# Patient Record
Sex: Male | Born: 1995 | Race: Black or African American | Hispanic: No | Marital: Single | Smoking: Never smoker
Health system: Southern US, Community
[De-identification: ages and names within clinical notes are randomized; demographics above are authoritative.]

## PROBLEM LIST (undated history)

## (undated) DIAGNOSIS — K5010 Crohn's disease of large intestine without complications: Secondary | ICD-10-CM

## (undated) HISTORY — PX: ESOPHAGOGASTRODUODENOSCOPY: SHX5428

## (undated) HISTORY — PX: OTHER SURGICAL HISTORY: SHX169

## (undated) HISTORY — PX: COLONOSCOPY: SHX174

---
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
## 2019-07-09 ENCOUNTER — Ambulatory Visit: Payer: Self-pay | Attending: Internal Medicine

## 2019-07-09 DIAGNOSIS — Z23 Encounter for immunization: Secondary | ICD-10-CM

## 2019-07-09 NOTE — Progress Notes (Signed)
   Covid-19 Vaccination Clinic  Name:  Isaiah Andersen    MRN: [id number] DOB: 12-15-1995  07/09/2019  Mr. [name] was observed post Covid-19 immunization for 15 minutes without incident. He was provided with Vaccine Information Sheet and instruction to access the V-Safe system.   Mr. [name] was instructed to call 911 with any severe reactions post vaccine: Marland Kitchen Difficulty breathing  . Swelling of face and throat  . A fast heartbeat  . A bad rash all over body  . Dizziness and weakness   Immunizations Administered    Name Date Dose VIS Date Route   Pfizer COVID-19 Vaccine 07/09/2019  2:31 PM 0.3 mL 03/24/2019 Intramuscular   Manufacturer: [employer], [employer]   Lot: [id number]   NDC: [address]-5

## 2019-08-01 ENCOUNTER — Ambulatory Visit: Payer: Self-pay | Attending: Internal Medicine

## 2019-08-01 DIAGNOSIS — Z23 Encounter for immunization: Secondary | ICD-10-CM

## 2019-08-01 NOTE — Progress Notes (Signed)
   Covid-19 Vaccination Clinic  Name:  Isaiah Andersen    MRN: [id number] DOB: Oct 20, 1995  08/01/2019  Mr. Isaiah Andersen was observed post Covid-19 immunization for 15 minutes without incident. He was provided with Vaccine Information Sheet and instruction to access the V-Safe system.   Mr. Isaiah Andersen was instructed to call 911 with any severe reactions post vaccine: Marland Kitchen Difficulty breathing  . Swelling of face and throat  . A fast heartbeat  . A bad rash all over body  . Dizziness and weakness   Immunizations Administered    Name Date Dose VIS Date Route   Pfizer COVID-19 Vaccine 08/01/2019  3:07 PM 0.3 mL 06/07/2018 Intramuscular   Manufacturer: [employer], [employer]   Lot: [id number]   NDC: [address]-3

## 2020-12-07 ENCOUNTER — Encounter: Payer: Self-pay | Admitting: Emergency Medicine

## 2020-12-07 ENCOUNTER — Emergency Department: Payer: 59

## 2020-12-07 ENCOUNTER — Observation Stay
Admission: EM | Admit: 2020-12-07 | Discharge: 2020-12-08 | Disposition: A | Discharge status: Home / Self Care | Payer: 59 | Attending: Internal Medicine | Admitting: Internal Medicine

## 2020-12-07 ENCOUNTER — Other Ambulatory Visit: Payer: Self-pay

## 2020-12-07 DIAGNOSIS — R030 Elevated blood-pressure reading, without diagnosis of hypertension: Secondary | ICD-10-CM

## 2020-12-07 DIAGNOSIS — K50012 Crohn's disease of small intestine with intestinal obstruction: Secondary | ICD-10-CM

## 2020-12-07 DIAGNOSIS — K529 Noninfective gastroenteritis and colitis, unspecified: Principal | ICD-10-CM | POA: Insufficient documentation

## 2020-12-07 DIAGNOSIS — K56609 Unspecified intestinal obstruction, unspecified as to partial versus complete obstruction: Secondary | ICD-10-CM | POA: Diagnosis not present

## 2020-12-07 DIAGNOSIS — R1031 Right lower quadrant pain: Secondary | ICD-10-CM | POA: Diagnosis present

## 2020-12-07 DIAGNOSIS — K5000 Crohn's disease of small intestine without complications: Secondary | ICD-10-CM | POA: Diagnosis present

## 2020-12-07 DIAGNOSIS — Z20822 Contact with and (suspected) exposure to covid-19: Secondary | ICD-10-CM | POA: Diagnosis not present

## 2020-12-07 DIAGNOSIS — Z9049 Acquired absence of other specified parts of digestive tract: Secondary | ICD-10-CM

## 2020-12-07 DIAGNOSIS — Z6838 Body mass index (BMI) 38.0-38.9, adult: Secondary | ICD-10-CM

## 2020-12-07 DIAGNOSIS — K56600 Partial intestinal obstruction, unspecified as to cause: Secondary | ICD-10-CM

## 2020-12-07 DIAGNOSIS — E669 Obesity, unspecified: Secondary | ICD-10-CM | POA: Diagnosis present

## 2020-12-07 DIAGNOSIS — K50119 Crohn's disease of large intestine with unspecified complications: Secondary | ICD-10-CM

## 2020-12-07 DIAGNOSIS — K50812 Crohn's disease of both small and large intestine with intestinal obstruction: Secondary | ICD-10-CM | POA: Diagnosis present

## 2020-12-07 HISTORY — DX: Crohn's disease of large intestine without complications: K50.10

## 2020-12-07 LAB — COMPREHENSIVE METABOLIC PANEL
ALT: 19 U/L (ref 0–44)
AST: 21 U/L (ref 15–41)
Albumin: 4.3 g/dL (ref 3.5–5.0)
Alkaline Phosphatase: 60 U/L (ref 38–126)
Anion gap: 8 (ref 5–15)
BUN: 12 mg/dL (ref 6–20)
CO2: 28 mmol/L (ref 22–32)
Calcium: 9.3 mg/dL (ref 8.9–10.3)
Chloride: 103 mmol/L (ref 98–111)
Creatinine, Ser: 0.93 mg/dL (ref 0.61–1.24)
GFR, Estimated: >60 mL/min (ref >60)
Glucose, Bld: 104 mg/dL — ABNORMAL HIGH (ref 70–99)
Potassium: 4.4 mmol/L (ref 3.5–5.1)
Sodium: 139 mmol/L (ref 135–145)
Total Bilirubin: 0.6 mg/dL (ref 0.3–1.2)
Total Protein: 7.4 g/dL (ref 6.5–8.1)

## 2020-12-07 LAB — CBC
HCT: 45 % (ref 39.0–52.0)
Hemoglobin: 14.9 g/dL (ref 13.0–17.0)
MCH: 27.2 pg (ref 26.0–34.0)
MCHC: 33.1 g/dL (ref 30.0–36.0)
MCV: 82.1 fL (ref 80.0–100.0)
Platelets: 458 10*3/uL — ABNORMAL HIGH (ref 150–400)
RBC: 5.48 MIL/uL (ref 4.22–5.81)
RDW: 13.5 % (ref 11.5–15.5)
WBC: 14.9 10*3/uL — ABNORMAL HIGH (ref 4.0–10.5)
nRBC: 0 % (ref 0.0–0.2)

## 2020-12-07 LAB — LIPASE, BLOOD: Lipase: 35 U/L (ref 11–51)

## 2020-12-07 LAB — RESP PANEL BY RT-PCR (FLU A&B, COVID) ARPGX2
Influenza A by PCR: NEGATIVE
Influenza B by PCR: NEGATIVE
SARS Coronavirus 2 by RT PCR: NEGATIVE

## 2020-12-07 MED ORDER — TRAZODONE HCL 50 MG PO TABS: 25.0000 mg | ORAL_TABLET | Freq: Every evening | ORAL | Status: DC | PRN

## 2020-12-07 MED ORDER — SODIUM CHLORIDE 0.9 % IV SOLN: INTRAVENOUS | Status: DC

## 2020-12-07 MED ORDER — MORPHINE SULFATE (PF) 4 MG/ML IV SOLN
4.0000 mg | Freq: Once | INTRAVENOUS | Status: AC
  Administered 2020-12-07: 4 mg via INTRAVENOUS
  Filled 2020-12-07: qty 1

## 2020-12-07 MED ORDER — METHYLPREDNISOLONE SODIUM SUCC 40 MG IJ SOLR: 40.0000 mg | Freq: Every day | INTRAMUSCULAR | Status: DC

## 2020-12-07 MED ORDER — MAGNESIUM HYDROXIDE 400 MG/5ML PO SUSP: 30.0000 mL | Freq: Every day | ORAL | Status: DC | PRN

## 2020-12-07 MED ORDER — SODIUM CHLORIDE 0.9 % IV BOLUS
1000.0000 mL | Freq: Once | INTRAVENOUS | Status: AC
  Administered 2020-12-07: 1000 mL via INTRAVENOUS

## 2020-12-07 MED ORDER — ENOXAPARIN SODIUM 60 MG/0.6ML IJ SOSY
60.0000 mg | PREFILLED_SYRINGE | INTRAMUSCULAR | Status: DC
  Administered 2020-12-07: 60 mg via SUBCUTANEOUS
  Filled 2020-12-07: qty 0.6

## 2020-12-07 MED ORDER — ONDANSETRON HCL 4 MG/2ML IJ SOLN: 4.0000 mg | Freq: Four times a day (QID) | INTRAMUSCULAR | Status: DC | PRN

## 2020-12-07 MED ORDER — IOHEXOL 350 MG/ML SOLN
80.0000 mL | Freq: Once | INTRAVENOUS | Status: AC | PRN
  Administered 2020-12-07: 80 mL via INTRAVENOUS

## 2020-12-07 MED ORDER — ONDANSETRON HCL 4 MG/2ML IJ SOLN
4.0000 mg | Freq: Once | INTRAMUSCULAR | Status: AC
  Administered 2020-12-07: 4 mg via INTRAVENOUS
  Filled 2020-12-07: qty 2

## 2020-12-07 MED ORDER — METHYLPREDNISOLONE SODIUM SUCC 125 MG IJ SOLR
60.0000 mg | Freq: Once | INTRAMUSCULAR | Status: AC
  Administered 2020-12-07: 60 mg via INTRAVENOUS
  Filled 2020-12-07: qty 2

## 2020-12-07 MED ORDER — ACETAMINOPHEN 650 MG RE SUPP: 650.0000 mg | Freq: Four times a day (QID) | RECTAL | Status: DC | PRN

## 2020-12-07 MED ORDER — ACETAMINOPHEN 325 MG PO TABS: 650.0000 mg | ORAL_TABLET | Freq: Four times a day (QID) | ORAL | Status: DC | PRN

## 2020-12-07 MED ORDER — MORPHINE SULFATE (PF) 2 MG/ML IV SOLN: 2.0000 mg | INTRAVENOUS | Status: DC | PRN

## 2020-12-07 MED ORDER — ONDANSETRON HCL 4 MG PO TABS: 4.0000 mg | ORAL_TABLET | Freq: Four times a day (QID) | ORAL | Status: DC | PRN

## 2020-12-07 NOTE — ED Notes (Signed)
Request made for transport to the floor ?

## 2020-12-07 NOTE — Progress Notes (Signed)
PHARMACIST - PHYSICIAN COMMUNICATION  CONCERNING:  Enoxaparin (Lovenox) for DVT Prophylaxis    RECOMMENDATION: Patient was prescribed enoxaprin 40mg  q24 hours for VTE prophylaxis.   Filed Weights   12/07/20 1618  Weight: 127 kg (280 lb)    Body mass index is 37.97 kg/m.  Estimated Creatinine Clearance: 165.8 mL/min (by C-G formula based on SCr of 0.93 mg/dL).   Based on [facility] policy patient is candidate for enoxaparin 0.5mg /kg TBW SQ every 24 hours based on BMI being >30.  DESCRIPTION: Pharmacy has adjusted enoxaparin dose per [facility] policy.  Patient is now receiving enoxaparin 60 mg every 24 hours    [facility], PharmD, BCPS Clinical Pharmacist  12/07/2020 8:10 PM

## 2020-12-07 NOTE — ED Triage Notes (Signed)
Pt via POV from home. Pt c/o RLQ abd pain and emesis since this AM. Denies diarrhea. Denies fever. Pt has a hx of Crohns Disease. Pt is A&OX4 and NAD.

## 2020-12-07 NOTE — ED Notes (Signed)
Informed RN bed assigned 

## 2020-12-07 NOTE — ED Notes (Signed)
Received secure chat from Burnadette Pop, RN that ok to bring patient up.

## 2020-12-07 NOTE — ED Notes (Signed)
Patient transported to CT 

## 2020-12-07 NOTE — ED Provider Notes (Signed)
[facility] Emergency Department Provider Note  Time seen: 10:48 PM  I have reviewed the triage vital signs and the nursing notes.   HISTORY  Chief Complaint Abdominal Pain   HPI Isaiah Andersen is a 25 y.o. male with a past medical history of Crohn's disease status post partial colectomy 9 years ago who is presents to the emergency department for right lower quadrant abdominal pain since yesterday now with nausea and vomiting and worsening pain today.  Patient denies any known fever.  States he has not had a Crohn's flare for approximately 10 years.  Has not followed up with a GI doctor recently.   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  Social History Social History   Tobacco Use   Smoking status: Never   Smokeless tobacco: Never  Substance Use Topics   Alcohol use: Never   Drug use: Never    Review of Systems Constitutional: Negative for fever. Cardiovascular: Negative for chest pain. Respiratory: Negative for shortness of breath. Gastrointestinal: Moderate right lower quadrant abdominal pain, dull. Genitourinary: Negative for urinary compaints Musculoskeletal: Negative for musculoskeletal complaints Neurological: Negative for headache All other ROS negative  ____________________________________________   PHYSICAL EXAM:  VITAL SIGNS: ED Triage Vitals  Enc Vitals Group     BP 12/07/20 1617 (!) 152/119     Pulse Rate 12/07/20 1617 83     Resp 12/07/20 1617 18     Temp 12/07/20 1617 98.7 F (37.1 C)     Temp Source 12/07/20 1617 Oral     SpO2 12/07/20 1617 100 %     Weight 12/07/20 1618 280 lb (127 kg)     Height 12/07/20 1618 6' (1.829 m)     Head Circumference --      Peak Flow --      Pain Score 12/07/20 1618 7      Pain Loc --      Pain Edu? --      Excl. in GC? --    Constitutional: Alert and oriented. Well appearing and in no distress. Eyes: Normal exam ENT      Head: Normocephalic and atraumatic.      Mouth/Throat: Mucous membranes are moist. Cardiovascular: Normal rate, regular rhythm.  Respiratory: Normal respiratory effort without tachypnea nor retractions. Breath sounds are clear  Gastrointestinal: Soft, moderate right lower quadrant tenderness palpation without rebound guarding or distention. Musculoskeletal: Nontender with normal range of motion in all extremities.  Neurologic:  Normal speech and language. No gross focal neurologic deficits Skin:  Skin is warm, dry and intact.  Psychiatric: Mood and affect are normal.   ____________________________________________    RADIOLOGY  IMPRESSION:  1. Postoperative changes and adjacent moderate to marked severity  enteritis of the terminal/distal ileum with subsequent partial small  bowel obstruction.  2. Hepatic steatosis.  3. Aortic atherosclerosis.   ____________________________________________   INITIAL IMPRESSION / ASSESSMENT AND PLAN / ED COURSE  Pertinent labs & imaging results that were available during my care of the patient were reviewed by me and considered in my medical decision making (see chart for details).   Patient presents emergency department for right lower quadrant abdominal pain, history of Crohn's disease but not had a flare  in approximately 10 years.  Patient is moderately tender in the right lower quadrant, has moderate leukocytosis.  We will obtain CT imaging to further evaluate.  Lab work otherwise largely nonrevealing.  Patient CT scan shows likely Crohn's flare in the terminal ileum causing a partial small bowel obstruction.  I spoke to Dr. Norma Fredrickson of GI medicine who recommends starting the patient on 60 mg of IV Solu-Medrol I have sent stool studies as well.  Patient will be admitted to the hospital  service for further work-up and treatment with GI consultation.  Isaiah Andersen was evaluated in Emergency Department on 12/07/2020 for the symptoms described in the history of present illness. He was evaluated in the context of the global COVID-19 pandemic, which necessitated consideration that the patient might be at risk for infection with the SARS-CoV-2 virus that causes COVID-19. Institutional protocols and algorithms that pertain to the evaluation of patients at risk for COVID-19 are in a state of rapid change based on information released by regulatory bodies including the CDC and federal and state organizations. These policies and algorithms were followed during the patient's care in the ED.  ____________________________________________   FINAL CLINICAL IMPRESSION(S) / ED DIAGNOSES  Partial small bowel obstruction Crohn's flare   Minna Antis, MD 12/07/20 2250

## 2020-12-07 NOTE — ED Notes (Signed)
Informed RN bed assigned again 

## 2020-12-07 NOTE — H&P (Signed)
[place]   PATIENT NAME: Isaiah Andersen    MR#:  [id number]  DATE OF BIRTH:  04--1997  DATE OF ADMISSION:  12/07/2020  PRIMARY CARE PHYSICIAN: Pcp, No   Patient is coming from: Home  REQUESTING/REFERRING PHYSICIAN: Minna Antis, MD  CHIEF COMPLAINT:   Chief Complaint  Patient presents with   Abdominal Pain    HISTORY OF PRESENT ILLNESS:  Isaiah Andersen is a 25 y.o. African-American male with medical history significant for Crohn's disease, who presented to the emergency room with acute onset of right lower quadrant abdominal pain with associated vomiting since this morning without diarrhea. Last BM was this am at 11am. No fever or chills. No bilious vomitus or hematemesis.  No cough or wheezing or dyspnea.  No chest pain or palpitations.  No dysuria, oliguria or hematuria or flank pain.  No melena or bright red bleeding per rectum.  No other bleeding diathesis.  ED Course: Upon presentation to the ER blood pressure was 152/119 and later 148/83 with otherwise normal vital signs  Imaging: Abdominal pelvic CT scan showed the following: 1. Postoperative changes and adjacent moderate to marked severity enteritis of the terminal/distal ileum with subsequent partial small bowel obstruction. 2. Hepatic steatosis. 3. Aortic atherosclerosis.  The patient was given 4 mg of IV morphine sulfate and 4 mg of IV Zofran, 1 L bolus of IV normal saline and 60 mg of IV Solu-Medrol.  Dr. Norma Fredrickson was notified about the patient.  The patient will be admitted to a medical bed for further evaluation and management.   PAST MEDICAL HISTORY:   Past Medical History:  Diagnosis Date   Crohn's colitis (HCC)     PAST SURGICAL HISTORY:  Partial colectomy  SOCIAL HISTORY:   Social History   Tobacco Use   Smoking status: Never   Smokeless tobacco: Never  Substance Use Topics   Alcohol use: Never    FAMILY HISTORY:  No family history on file.  He denies any familial diseases.  DRUG  ALLERGIES:  No Known Allergies  REVIEW OF SYSTEMS:   ROS As per history of present illness. All pertinent systems were reviewed above. Constitutional, HEENT, cardiovascular, respiratory, GI, GU, musculoskeletal, neuro, psychiatric, endocrine, integumentary and hematologic systems were reviewed and are otherwise negative/unremarkable except for positive findings mentioned above in the HPI.   MEDICATIONS AT HOME:   Prior to Admission medications   Not on File      VITAL SIGNS:  Blood pressure (!) 148/83, pulse 76, temperature 98.7 F (37.1 C), temperature source Oral, resp. rate 18, height 6' (1.829 m), weight 127 kg, SpO2 100 %.  PHYSICAL EXAMINATION:  Physical Exam  GENERAL:  25 y.o.-year-old obese African-American male patient lying in the bed with no acute distress.  EYES: Pupils equal, round, reactive to light and accommodation. No scleral icterus. Extraocular muscles intact.  HEENT: Head atraumatic, normocephalic. Oropharynx and nasopharynx clear.  NECK:  Supple, no jugular venous distention. No thyroid enlargement, no tenderness.  LUNGS: Normal breath sounds bilaterally, no wheezing, rales,rhonchi or crepitation. No use of accessory muscles of respiration.  CARDIOVASCULAR: Regular rate and rhythm, S1, S2 normal. No murmurs, rubs, or gallops.  ABDOMEN: Soft, nondistended, with right lower quadrant abdominal tenderness without rebound tenderness or guarding or rigidity. Bowel sounds were diminished.  No organomegaly or mass.  EXTREMITIES: No pedal edema, cyanosis, or clubbing.  NEUROLOGIC: Cranial nerves II through XII are intact. Muscle strength 5/5 in all extremities. Sensation intact. Gait not checked.  PSYCHIATRIC: The patient is alert and oriented x 3.  Normal affect and good eye contact. SKIN: No obvious rash, lesion, or ulcer.   LABORATORY PANEL:   CBC Recent Labs  Lab 12/07/20 1618  WBC 14.9*  HGB 14.9  HCT 45.0  PLT 458*    ------------------------------------------------------------------------------------------------------------------  Chemistries  Recent Labs  Lab 12/07/20 1618  NA 139  K 4.4  CL 103  CO2 28  GLUCOSE 104*  BUN 12  CREATININE 0.93  CALCIUM 9.3  AST 21  ALT 19  ALKPHOS 60  BILITOT 0.6   ------------------------------------------------------------------------------------------------------------------  Cardiac Enzymes No results for input(s): TROPONINI in the last 168 hours. ------------------------------------------------------------------------------------------------------------------  RADIOLOGY:  Abdominal pelvic CT scan showed the following: 1. Postoperative changes and adjacent moderate to marked severity enteritis of the terminal/distal ileum with subsequent partial small bowel obstruction. 2. Hepatic steatosis. 3. Aortic atherosclerosis.  IMPRESSION AND PLAN:  Active Problems:   Terminal ileitis (HCC)  1.  Crohn's disease with acute flare with moderate to severe enteritis of the terminal/distal ileum. - The patient will be admitted to a medical bed. - We will continue steroid therapy. - As needed antiemetics will be provided.   - We will add IV PPI therapy. - Pain management to be provided. - GI consultation will be obtained. - Dr. Norma Fredrickson was notified about the patient.  2.  Partial small bowel obstruction secondary to severe enteritis of the distal/terminal ileum. - The patient will be kept n.p.o. - We will place him on hydration with IV normal saline.  3.  Elevated blood pressure. - This likely secondary to pain. - Adequate pain management. - As needed labetalol will be provided.   DVT prophylaxis: Lovenox.  Code Status: full code.  Family Communication:  The plan of care was discussed in details with the patient (and family). I answered all questions. The patient agreed to proceed with the above mentioned plan. Further management will depend upon  hospital course. Disposition Plan: Back to previous home environment Consults called: Gastroenterology. All the records are reviewed and case discussed with ED provider.  Status is: Inpatient  Remains inpatient appropriate because:Ongoing active pain requiring inpatient pain management, Ongoing diagnostic testing needed not appropriate for outpatient work up, Unsafe d/c plan, IV treatments appropriate due to intensity of illness or inability to take PO, and Inpatient level of care appropriate due to severity of illness  Dispo: The patient is from: Home              Anticipated d/c is to: Home              Patient currently is not medically stable to d/c.   Difficult to place patient No   TOTAL TIME TAKING CARE OF THIS PATIENT: 55 minutes.    Hannah Beat M.D on 12/07/2020 at 7:56 PM  [facility]   From 7 PM-7 AM, contact night-coverage [email or web address]  CC: Primary care physician; Pcp, No

## 2020-12-08 ENCOUNTER — Encounter: Payer: Self-pay | Admitting: Family Medicine

## 2020-12-08 ENCOUNTER — Inpatient Hospital Stay: Payer: 59

## 2020-12-08 DIAGNOSIS — K50012 Crohn's disease of small intestine with intestinal obstruction: Secondary | ICD-10-CM | POA: Diagnosis not present

## 2020-12-08 LAB — CBC
HCT: 45.3 % (ref 39.0–52.0)
Hemoglobin: 14.4 g/dL (ref 13.0–17.0)
MCH: 25.9 pg — ABNORMAL LOW (ref 26.0–34.0)
MCHC: 31.8 g/dL (ref 30.0–36.0)
MCV: 81.5 fL (ref 80.0–100.0)
Platelets: 468 10*3/uL — ABNORMAL HIGH (ref 150–400)
RBC: 5.56 MIL/uL (ref 4.22–5.81)
RDW: 13.6 % (ref 11.5–15.5)
WBC: 11.1 10*3/uL — ABNORMAL HIGH (ref 4.0–10.5)
nRBC: 0 % (ref 0.0–0.2)

## 2020-12-08 LAB — URINALYSIS, COMPLETE (UACMP) WITH MICROSCOPIC
Bacteria, UA: NONE SEEN
Bilirubin Urine: NEGATIVE
Glucose, UA: NEGATIVE mg/dL
Hgb urine dipstick: NEGATIVE
Ketones, ur: 5 mg/dL — AB
Leukocytes,Ua: NEGATIVE
Nitrite: NEGATIVE
Protein, ur: NEGATIVE mg/dL
Specific Gravity, Urine: 1.028 (ref 1.005–1.030)
Squamous Epithelial / HPF: NONE SEEN (ref 0–5)
pH: 6 (ref 5.0–8.0)

## 2020-12-08 LAB — BASIC METABOLIC PANEL
Anion gap: 9 (ref 5–15)
BUN: 10 mg/dL (ref 6–20)
CO2: 27 mmol/L (ref 22–32)
Calcium: 9.2 mg/dL (ref 8.9–10.3)
Chloride: 101 mmol/L (ref 98–111)
Creatinine, Ser: 0.84 mg/dL (ref 0.61–1.24)
GFR, Estimated: >60 mL/min (ref >60)
Glucose, Bld: 124 mg/dL — ABNORMAL HIGH (ref 70–99)
Potassium: 4.1 mmol/L (ref 3.5–5.1)
Sodium: 137 mmol/L (ref 135–145)

## 2020-12-08 LAB — C-REACTIVE PROTEIN: CRP: 1.1 mg/dL — ABNORMAL HIGH (ref <1.0)

## 2020-12-08 LAB — HIV ANTIBODY (ROUTINE TESTING W REFLEX): HIV Screen 4th Generation wRfx: NONREACTIVE

## 2020-12-08 MED ORDER — METHYLPREDNISOLONE SODIUM SUCC 125 MG IJ SOLR: 60.0000 mg | Freq: Every day | INTRAMUSCULAR | Status: DC

## 2020-12-08 MED ORDER — PREDNISONE 20 MG PO TABS: 40.0000 mg | ORAL_TABLET | Freq: Every day | ORAL | 0 refills | Status: AC

## 2020-12-08 MED ORDER — ONDANSETRON HCL 4 MG PO TABS: 4.0000 mg | ORAL_TABLET | Freq: Four times a day (QID) | ORAL | 0 refills | Status: AC | PRN

## 2020-12-08 MED ORDER — COVID-19 MRNA VAC-TRIS(PFIZER) 30 MCG/0.3ML IM SUSP: 0.3000 mL | Freq: Once | INTRAMUSCULAR | Status: DC

## 2020-12-08 NOTE — Plan of Care (Signed)
Continuing with plan of care. 

## 2020-12-08 NOTE — Plan of Care (Signed)
Discharge teaching completed with patient who is in stable condition. 

## 2020-12-08 NOTE — Consult Note (Signed)
[facility] GI Inpatient Consult Note   Jamey Reas, M.D.  Reason for Consult: Crhon's disease flare, abdominal pain, partial small bowel obstruction   Attending Requesting Consult: Minna Antis, M.D. (ED)                                                       Valente David, M.D. (Hospitalist)  History of Present Illness: Isaiah Andersen is a 25 y.o. male with history of small bowel and large bowel Crohn's disease status post partial colectomy.  Patient has not followed up in several years with a gastroenterologist and has reportedly taken no Crohn's therapy EVER up to the time of presentation yesterday evening to the emergency room. Remote outpatient family medicine notes reviewed from 2017 revealing patient was in an extended state of remission, stating he felt like he "didn't have it" at the time. I don't see a visit to a gastroenterologist in the record ([facility]) since 2013. Procedure and office visit notes not accessible in [facility], though there is record that he has seen Dr. Barton Fanny at [facility] GI department in 2013 and underwent a colonoscopy (no documentation avail) 03/09/2012.  Patient says over the past week, he had experienced intermittent, sometimes severe (8 out of 10) cramping in the RLQ exacerbated with postural changes of the trunk. It culminated in nausea and vomiting yesterday morning, prompting him to report to the ED in the afternoon. Patient's normal bowel habits is usually "3 loose stools" per day without fecal urgency, tenesmus or hematochezia. Yesterday and the day prior he was unable to have a bowel movement. CT compatible with terminal ileitis with PSBO near the ileocolic anastomosis. I instructed ED to run some stool tests which have not been obtained due to PSBO. I advised Solumedrol 60mg  IV also yesterday evening. Patient made NPO. Today, patient reports severity of pain in RLQ is "0 or 1" out of 10 and he would like to eat. He is agreeable to following up  with a gastroenterologist,but wants to stay in [facility] for GI management.    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 No pertinent surgical history.  Allergies: No Known Allergies  Home Medications: No medications prior to admission.   Home medication reconciliation was completed with the patient.   Scheduled Inpatient Medications:    enoxaparin (LOVENOX) injection  60 mg Subcutaneous Q24H   [START ON 12/09/2020] methylPREDNISolone (SOLU-MEDROL) injection  60 mg Intravenous Daily    Continuous Inpatient Infusions:    sodium chloride 100 mL/hr at 12/08/20 0721    PRN Inpatient Medications:  acetaminophen **OR** acetaminophen, magnesium hydroxide, morphine injection, ondansetron **OR** ondansetron (ZOFRAN) IV, traZODone  Family History: family history is not on file.   GI Family History: Negative  Social History:   reports that he has never smoked. He has never used smokeless tobacco. He reports that he does not drink alcohol and does not use drugs. The patient denies ETOH, tobacco, or drug use.    Review of Systems: Review of Systems - General ROS: negative Psychological ROS: negative Ophthalmic ROS: negative for - blurry vision, decreased vision, dry  eyes, or eye pain ENT ROS: negative for - oral lesions or sore throat Allergy and Immunology ROS: negative Hematological and Lymphatic ROS: negative Endocrine ROS: negative Respiratory ROS: no cough, shortness of breath, or wheezing Cardiovascular ROS: no chest pain or dyspnea on exertion Genito-Urinary ROS: no dysuria, trouble voiding, or hematuria Musculoskeletal ROS: negative Neurological ROS: no TIA or stroke symptoms Dermatological ROS: negative  Physical Examination: BP (!) 128/93 (BP Location: Left Arm)    Pulse 63   Temp 98 F (36.7 C) (Oral)   Resp 20   Ht 6' (1.829 m)   Wt 129.7 kg   SpO2 100%   BMI 38.78 kg/m  Physical Exam  Data: Lab Results  Component Value Date   WBC 11.1 (H) 12/08/2020   HGB 14.4 12/08/2020   HCT 45.3 12/08/2020   MCV 81.5 12/08/2020   PLT 468 (H) 12/08/2020   Recent Labs  Lab 12/07/20 1618 12/08/20 0416  HGB 14.9 14.4   Lab Results  Component Value Date   NA 137 12/08/2020   K 4.1 12/08/2020   CL 101 12/08/2020   CO2 27 12/08/2020   BUN 10 12/08/2020   CREATININE 0.84 12/08/2020   Lab Results  Component Value Date   ALT 19 12/07/2020   AST 21 12/07/2020   ALKPHOS 60 12/07/2020   BILITOT 0.6 12/07/2020   No results for input(s): APTT, INR, PTT in the last 168 hours. CBC Latest Ref Rng & Units 12/08/2020 12/07/2020  WBC 4.0 - 10.5 K/uL 11.1(H) 14.9(H)  Hemoglobin 13.0 - 17.0 g/dL 09.9 83.3  Hematocrit 82.5 - 52.0 % 45.3 45.0  Platelets 150 - 400 K/uL 468(H) 458(H)    STUDIES: CT ABDOMEN PELVIS W CONTRAST  Result Date: 12/07/2020 CLINICAL DATA:  History of Crohn's disease with right lower quadrant pain. EXAM: CT ABDOMEN AND PELVIS WITH CONTRAST TECHNIQUE: Multidetector CT imaging of the abdomen and pelvis was performed using the standard protocol following bolus administration of intravenous contrast. CONTRAST:  9mL OMNIPAQUE IOHEXOL 350 MG/ML SOLN COMPARISON:  None. FINDINGS: Lower chest: No acute abnormality. Hepatobiliary: There is diffuse fatty infiltration of the liver parenchyma. No focal liver abnormality is seen. No gallstones, gallbladder wall thickening, or biliary dilatation. Pancreas: Unremarkable. No pancreatic ductal dilatation or surrounding inflammatory changes. Spleen: Normal in size without focal abnormality. Adrenals/Urinary Tract: Adrenal glands are unremarkable. Kidneys are normal, without renal calculi, focal lesion, or hydronephrosis. Bladder is unremarkable. Stomach/Bowel: Stomach is within normal limits. Postoperative  changes are seen with surgically anastomosed bowel noted along the expected region of the distal ascending colon. Multiple dilated, moderate to markedly thickened and inflamed loops of distal and terminal ileum are seen (maximum small bowel diameter of approximately 3.1 cm). A transition zone is seen at the surgical anastomosis site. A mild amount of peri-intestinal fluid is present in this region. Vascular/Lymphatic: Aortic atherosclerosis. No enlarged abdominal or pelvic lymph nodes. Reproductive: Prostate is unremarkable. Other: No abdominal wall hernia or abnormality. Musculoskeletal: No acute or significant osseous findings. IMPRESSION: 1. Postoperative changes and adjacent moderate to marked severity enteritis of the terminal/distal ileum with subsequent partial small bowel obstruction. 2. Hepatic steatosis. 3. Aortic atherosclerosis. Aortic Atherosclerosis (ICD10-I70.0). Electronically Signed   By: Aram Candela M.D.   On: 12/07/2020 18:54   DG Abd 2 Views  Result Date: 12/08/2020 CLINICAL DATA:  25 year old male with partial small bowel obstruction suspected on CT yesterday. Crohn disease. EXAM: ABDOMEN - 2 VIEW COMPARISON:  CT Abdomen and Pelvis 10/30/2020. FINDINGS: Upright and supine views  of the abdomen and pelvis. Negative lung bases. Bowel anastomosis redemonstrated in the right abdomen. Increased distention of the urinary bladder. Bowel-gas pattern appears stable from the CT scout view yesterday, although the fluid-filled right abdominal loops were not apparent at that time either. Other abdominal and pelvic visceral contours appear normal. No osseous abnormality identified. IMPRESSION: Stable bowel-gas pattern from the CT scout view yesterday, appearing normal although the fluid distended right abdominal small bowel loops in vicinity of the bowel anastomosis were occult at that time. No free air. Electronically Signed   By: Odessa Fleming M.D.   On: 12/08/2020 08:33    @IMAGES @  Assessment:  Crohn's disease flare - appears mainly in distal small bowel near surgical anastomosis. Clinically improved with IV steroids. Abdominal pain secondary to Crohn's - Clinically resolved. Partial SBO - Clinically resolving.  COVID-19 status:Tested negative     Recommendations: Continue steroids. Switch to po prednisone 40mg  daily. Advance to soft diet. Patient may be discharged if tolerates diet without symptoms. Patient agreeable to follow up with me in 1-2 weeks at [facility]. Advise outpatient colonoscopy. Patient agreeable to set up at office appointment.  Recommendations discussed with Dr. .  Thank you for the consult. Please call with questions or concerns.  [facility], "Georgeann Oppenheim MD [facility] [address] [place], [address] [place] [phone]  12/08/2020 11:52 AM

## 2020-12-08 NOTE — Discharge Summary (Signed)
Physician Discharge Summary  Isaiah Andersen ZOX:[id number] DOB: [birth year] DOA: 12/07/2020  PCP: Pcp, No  Admit date: 12/07/2020 Discharge date: 12/08/2020  Admitted From: Home Disposition: Home  Recommendations for Outpatient Follow-up:  Follow-up with gastroenterology within 1 to 2 weeks  Home Health: No Equipment/Devices: None  Discharge Condition: Stable CODE STATUS: Full Diet recommendation: Soft/bland  Brief/Interim Summary:  25 y.o. African-American male with medical history significant for Crohn's disease, who presented to the emergency room with acute onset of right lower quadrant abdominal pain with associated vomiting since this morning without diarrhea. Last BM was this am at 11am. No fever or chills. No bilious vomitus or hematemesis.  No cough or wheezing or dyspnea.  No chest pain or palpitations.  No dysuria, oliguria or hematuria or flank pain.  No melena or bright red bleeding per rectum.   GI contacted from ED.  Recommended initiation of 60 mg of IV Solu-Medrol daily.  This has been initiated with good result.  Patient endorses improvement in abdominal pain.  Downtrending leukocytosis.  Hemodynamically stable.  Seen in consultation with GI.  Diet was advanced to soft.  Patient tolerated without issue.  No abdominal pain.  Passing flatus.  Cleared for discharge at this time.  Per GI recommendations will discharge on prednisone 40 mg daily.  Will provide 2-week course.  Patient is instructed to follow-up with gastroenterology as outpatient within this 2-week window.  Recommended to maintain soft bland diet with adequate p.o. hydration.  Expressed understanding.  Discharged in stable condition.  Discharge Diagnoses:  Active Problems:   Terminal ileitis (HCC)  Suspected acute Crohn's flare with moderate-severe enteritis of the distal/terminal ileum Patient has history of Crohn's but has not had flare in 10 years Does not follow regularly with GI No clear trigger for  flare GI contacted from ED Seen in consultation Diet advance to soft, tolerating at time of discharge Discharge home with 2-week course of prednisone 40 mg daily Follow-up outpatient GI within 2 weeks   Partial SBO secondary to severe enteritis Diet advance to soft and patient tolerating without issue Is passing flatus Discharge home with outpatient GI follow-up   Elevated blood pressure Likely secondary to pain Resolved and pain-free at time of discharge   Obesity BMI 38.7 This complicates overall care and prognosis  Discharge Instructions  Discharge Instructions     Diet - low sodium heart healthy   Complete by: As directed    Increase activity slowly   Complete by: As directed       Allergies as of 12/08/2020   No Known Allergies      Medication List     TAKE these medications    ondansetron 4 MG tablet Commonly known as: ZOFRAN Take 1 tablet (4 mg total) by mouth every 6 (six) hours as needed for nausea.   predniSONE 20 MG tablet Commonly known as: DELTASONE Take 2 tablets (40 mg total) by mouth daily for 14 days.        Follow-up Information     [place], Boykin Nearing, MD. Schedule an appointment as soon as possible for a visit.   Specialty: Gastroenterology Why: Call office to set up follow up appointment within 2 weeks Contact information: [address] [place] [address] [phone]                No Known Allergies  Consultations: GI   Procedures/Studies: CT ABDOMEN PELVIS W CONTRAST  Result Date: 12/07/2020 CLINICAL DATA:  History of Crohn's disease with right  lower quadrant pain. EXAM: CT ABDOMEN AND PELVIS WITH CONTRAST TECHNIQUE: Multidetector CT imaging of the abdomen and pelvis was performed using the standard protocol following bolus administration of intravenous contrast. CONTRAST:  80mL OMNIPAQUE IOHEXOL 350 MG/ML SOLN COMPARISON:  None. FINDINGS: Lower chest: No acute abnormality. Hepatobiliary: There is diffuse  fatty infiltration of the liver parenchyma. No focal liver abnormality is seen. No gallstones, gallbladder wall thickening, or biliary dilatation. Pancreas: Unremarkable. No pancreatic ductal dilatation or surrounding inflammatory changes. Spleen: Normal in size without focal abnormality. Adrenals/Urinary Tract: Adrenal glands are unremarkable. Kidneys are normal, without renal calculi, focal lesion, or hydronephrosis. Bladder is unremarkable. Stomach/Bowel: Stomach is within normal limits. Postoperative changes are seen with surgically anastomosed bowel noted along the expected region of the distal ascending colon. Multiple dilated, moderate to markedly thickened and inflamed loops of distal and terminal ileum are seen (maximum small bowel diameter of approximately 3.1 cm). A transition zone is seen at the surgical anastomosis site. A mild amount of peri-intestinal fluid is present in this region. Vascular/Lymphatic: Aortic atherosclerosis. No enlarged abdominal or pelvic lymph nodes. Reproductive: Prostate is unremarkable. Other: No abdominal wall hernia or abnormality. Musculoskeletal: No acute or significant osseous findings. IMPRESSION: 1. Postoperative changes and adjacent moderate to marked severity enteritis of the terminal/distal ileum with subsequent partial small bowel obstruction. 2. Hepatic steatosis. 3. Aortic atherosclerosis. Aortic Atherosclerosis (ICD10-I70.0). Electronically Signed   By: Aram Candelahaddeus  Houston M.D.   On: 12/07/2020 18:54   DG Abd 2 Views  Result Date: 12/08/2020 CLINICAL DATA:  25 year old male with partial small bowel obstruction suspected on CT yesterday. Crohn disease. EXAM: ABDOMEN - 2 VIEW COMPARISON:  CT Abdomen and Pelvis 10/30/2020. FINDINGS: Upright and supine views of the abdomen and pelvis. Negative lung bases. Bowel anastomosis redemonstrated in the right abdomen. Increased distention of the urinary bladder. Bowel-gas pattern appears stable from the CT scout view  yesterday, although the fluid-filled right abdominal loops were not apparent at that time either. Other abdominal and pelvic visceral contours appear normal. No osseous abnormality identified. IMPRESSION: Stable bowel-gas pattern from the CT scout view yesterday, appearing normal although the fluid distended right abdominal small bowel loops in vicinity of the bowel anastomosis were occult at that time. No free air. Electronically Signed   By: Odessa FlemingH  Hall M.D.   On: 12/08/2020 08:33   (Echo, Carotid, EGD, Colonoscopy, ERCP)    Subjective: Seen and examined on the day of discharge.  Stable no distress.  Abdominal pain resolved.  Tolerating p.o. intake.  Discharge Exam: Vitals:   12/08/20 0452 12/08/20 0754  BP: 123/80 (!) 128/93  Pulse: 69 63  Resp: 16 20  Temp: 98.3 F (36.8 C) 98 F (36.7 C)  SpO2: 100% 100%   Vitals:   12/07/20 2228 12/07/20 2318 12/08/20 0452 12/08/20 0754  BP: 125/83 (!) 142/87 123/80 (!) 128/93  Pulse: 71 73 69 63  Resp:  16 16 20   Temp: 98.7 F (37.1 C) 98.6 F (37 C) 98.3 F (36.8 C) 98 F (36.7 C)  TempSrc: Oral Oral Oral Oral  SpO2: 97% 100% 100% 100%  Weight:   129.7 kg   Height:   6' (1.829 m)     General: Pt is alert, awake, not in acute distress Cardiovascular: RRR, S1/S2 +, no rubs, no gallops Respiratory: CTA bilaterally, no wheezing, no rhonchi Abdominal: Soft, NT, ND, bowel sounds + Extremities: no edema, no cyanosis    The results of significant diagnostics from this hospitalization (including imaging, microbiology, ancillary and  laboratory) are listed below for reference.     Microbiology: Recent Results (from the past 240 hour(s))  Resp Panel by RT-PCR (Flu A&B, Covid) Nasopharyngeal Swab     Status: None   Collection Time: 12/07/20  8:04 PM   Specimen: Nasopharyngeal Swab; Nasopharyngeal(NP) swabs in vial transport medium  Result Value Ref Range Status   SARS Coronavirus 2 by RT PCR NEGATIVE NEGATIVE Final    Comment:  (NOTE) SARS-CoV-2 target nucleic acids are NOT DETECTED.  The SARS-CoV-2 RNA is generally detectable in upper respiratory specimens during the acute phase of infection. The lowest concentration of SARS-CoV-2 viral copies this assay can detect is 138 copies/mL. A negative result does not preclude SARS-Cov-2 infection and should not be used as the sole basis for treatment or other patient management decisions. A negative result may occur with  improper specimen collection/handling, submission of specimen other than nasopharyngeal swab, presence of viral mutation(s) within the areas targeted by this assay, and inadequate number of viral copies(<138 copies/mL). A negative result must be combined with clinical observations, patient history, and epidemiological information. The expected result is Negative.  Fact Sheet for Patients:  BloggerCourse.com  Fact Sheet for Healthcare Providers:  SeriousBroker.it  This test is no t yet approved or cleared by the [place] FDA and  has been authorized for detection and/or diagnosis of SARS-CoV-2 by FDA under an Emergency Use Authorization (EUA). This EUA will remain  in effect (meaning this test can be used) for the duration of the COVID-19 declaration under Section 564(b)(1) of the Act, 21 U.S.C.section 360bbb-3(b)(1), unless the authorization is terminated  or revoked sooner.       Influenza A by PCR NEGATIVE NEGATIVE Final   Influenza B by PCR NEGATIVE NEGATIVE Final    Comment: (NOTE) The Xpert Xpress SARS-CoV-2/FLU/RSV plus assay is intended as an aid in the diagnosis of influenza from Nasopharyngeal swab specimens and should not be used as a sole basis for treatment. Nasal washings and aspirates are unacceptable for Xpert Xpress SARS-CoV-2/FLU/RSV testing.  Fact Sheet for Patients: BloggerCourse.com  Fact Sheet for Healthcare  Providers: SeriousBroker.it  This test is not yet approved or cleared by the [place] FDA and has been authorized for detection and/or diagnosis of SARS-CoV-2 by FDA under an Emergency Use Authorization (EUA). This EUA will remain in effect (meaning this test can be used) for the duration of the COVID-19 declaration under Section 564(b)(1) of the Act, 21 U.S.C. section 360bbb-3(b)(1), unless the authorization is terminated or revoked.  Performed at [facility], [address] [place], [place] [address]      Labs: BNP (last 3 results) No results for input(s): BNP in the last 8760 hours. Basic Metabolic Panel: Recent Labs  Lab 12/07/20 1618 12/08/20 0416  NA 139 137  K 4.4 4.1  CL 103 101  CO2 28 27  GLUCOSE 104* 124*  BUN 12 10  CREATININE 0.93 0.84  CALCIUM 9.3 9.2   Liver Function Tests: Recent Labs  Lab 12/07/20 1618  AST 21  ALT 19  ALKPHOS 60  BILITOT 0.6  PROT 7.4  ALBUMIN 4.3   Recent Labs  Lab 12/07/20 1618  LIPASE 35   No results for input(s): AMMONIA in the last 168 hours. CBC: Recent Labs  Lab 12/07/20 1618 12/08/20 0416  WBC 14.9* 11.1*  HGB 14.9 14.4  HCT 45.0 45.3  MCV 82.1 81.5  PLT 458* 468*   Cardiac Enzymes: No results for input(s): CKTOTAL, CKMB, CKMBINDEX, TROPONINI in  the last 168 hours. BNP: Invalid input(s): POCBNP CBG: No results for input(s): GLUCAP in the last 168 hours. D-Dimer No results for input(s): DDIMER in the last 72 hours. Hgb A1c No results for input(s): HGBA1C in the last 72 hours. Lipid Profile No results for input(s): CHOL, HDL, LDLCALC, TRIG, CHOLHDL, LDLDIRECT in the last 72 hours. Thyroid function studies No results for input(s): TSH, T4TOTAL, T3FREE, THYROIDAB in the last 72 hours.  Invalid input(s): FREET3 Anemia work up No results for input(s): VITAMINB12, FOLATE, FERRITIN, TIBC, IRON, RETICCTPCT in the last 72 hours. Urinalysis    Component Value  Date/Time   COLORURINE YELLOW (A) 12/08/2020 1215   APPEARANCEUR CLEAR (A) 12/08/2020 1215   LABSPEC 1.028 12/08/2020 1215   PHURINE 6.0 12/08/2020 1215   GLUCOSEU NEGATIVE 12/08/2020 1215   HGBUR NEGATIVE 12/08/2020 1215   BILIRUBINUR NEGATIVE 12/08/2020 1215   KETONESUR 5 (A) 12/08/2020 1215   PROTEINUR NEGATIVE 12/08/2020 1215   NITRITE NEGATIVE 12/08/2020 1215   LEUKOCYTESUR NEGATIVE 12/08/2020 1215   Sepsis Labs Invalid input(s): PROCALCITONIN,  WBC,  LACTICIDVEN Microbiology Recent Results (from the past 240 hour(s))  Resp Panel by RT-PCR (Flu A&B, Covid) Nasopharyngeal Swab     Status: None   Collection Time: 12/07/20  8:04 PM   Specimen: Nasopharyngeal Swab; Nasopharyngeal(NP) swabs in vial transport medium  Result Value Ref Range Status   SARS Coronavirus 2 by RT PCR NEGATIVE NEGATIVE Final    Comment: (NOTE) SARS-CoV-2 target nucleic acids are NOT DETECTED.  The SARS-CoV-2 RNA is generally detectable in upper respiratory specimens during the acute phase of infection. The lowest concentration of SARS-CoV-2 viral copies this assay can detect is 138 copies/mL. A negative result does not preclude SARS-Cov-2 infection and should not be used as the sole basis for treatment or other patient management decisions. A negative result may occur with  improper specimen collection/handling, submission of specimen other than nasopharyngeal swab, presence of viral mutation(s) within the areas targeted by this assay, and inadequate number of viral copies(<138 copies/mL). A negative result must be combined with clinical observations, patient history, and epidemiological information. The expected result is Negative.  Fact Sheet for Patients:  BloggerCourse.com  Fact Sheet for Healthcare Providers:  SeriousBroker.it  This test is no t yet approved or cleared by the [place] FDA and  has been authorized for detection and/or  diagnosis of SARS-CoV-2 by FDA under an Emergency Use Authorization (EUA). This EUA will remain  in effect (meaning this test can be used) for the duration of the COVID-19 declaration under Section 564(b)(1) of the Act, 21 U.S.C.section 360bbb-3(b)(1), unless the authorization is terminated  or revoked sooner.       Influenza A by PCR NEGATIVE NEGATIVE Final   Influenza B by PCR NEGATIVE NEGATIVE Final    Comment: (NOTE) The Xpert Xpress SARS-CoV-2/FLU/RSV plus assay is intended as an aid in the diagnosis of influenza from Nasopharyngeal swab specimens and should not be used as a sole basis for treatment. Nasal washings and aspirates are unacceptable for Xpert Xpress SARS-CoV-2/FLU/RSV testing.  Fact Sheet for Patients: BloggerCourse.com  Fact Sheet for Healthcare Providers: SeriousBroker.it  This test is not yet approved or cleared by the [place] FDA and has been authorized for detection and/or diagnosis of SARS-CoV-2 by FDA under an Emergency Use Authorization (EUA). This EUA will remain in effect (meaning this test can be used) for the duration of the COVID-19 declaration under Section 564(b)(1) of the Act, 21 U.S.C. section 360bbb-3(b)(1), unless the authorization  is terminated or revoked.  Performed at [facility], [address] [place], [place] [address]      Time coordinating discharge: Over 30 minutes  SIGNED:   Tresa Moore, MD  [facility] 12/08/2020, 2:54 PM Pager   If 7PM-7AM, please contact night-coverage

## 2020-12-08 NOTE — Progress Notes (Signed)
Brief  Gastroenterology consult note  Admitting diagnosis: Flare of Crohn's disease, partial SBO, nausea and vomiting.  Patient seen by consultant  Consult note dictated:12/08/20  Recommend to proceed with surgery or procedure:None  Recommend further assessment or treatment:Outpatient colonoscopy  Orders entered:Solumedrol  Discussed with Attending IR:[email or web address], Mansy     Comments:Consider switch to oral steroids today. May advance diet. Please see orders. Formal consult forthcoming.   Rosina Lowenstein, MD  11:55 AM 12/08/2020

## 2020-12-08 NOTE — Progress Notes (Signed)
PROGRESS NOTE    Isaiah Andersen  PNT:[id number] DOB: 01-17-1996 DOA: 12/07/2020 PCP: Pcp, No    Brief Narrative:  25 y.o. African-American male with medical history significant for Crohn's disease, who presented to the emergency room with acute onset of right lower quadrant abdominal pain with associated vomiting since this morning without diarrhea. Last BM was this am at 11am. No fever or chills. No bilious vomitus or hematemesis.  No cough or wheezing or dyspnea.  No chest pain or palpitations.  No dysuria, oliguria or hematuria or flank pain.  No melena or bright red bleeding per rectum.  GI contacted from ED.  Recommended initiation of 60 mg of IV Solu-Medrol daily.  This has been initiated with good result.  Patient endorses improvement in abdominal pain.  Downtrending leukocytosis.  Hemodynamically stable.   Assessment & Plan:   Active Problems:   Terminal ileitis (HCC)  Suspected acute Crohn's flare with moderate-severe enteritis of the distal/terminal ileum Patient has history of Crohn's but has not had flare in 10 years Does not follow regularly with GI No clear trigger for flare GI contacted from ED Plan: Continue Solu-Medrol 60 mg IV daily As needed antiemetics IV PPI As needed pain control Continue n.p.o. for now pending GI follow-up  Partial SBO secondary to severe enteritis Will keep patient n.p.o. for now Continue IV hydration with normal saline Patient not nauseous or vomiting, defer NG tube placement  Elevated blood pressure Likely secondary to pain Ensure pain management As needed labetalol  Obesity BMI 38.7 This complicates overall care and prognosis   DVT prophylaxis: SQ Lovenox Code Status: Full Family Communication: None today Disposition Plan: Status is: Inpatient  Remains inpatient appropriate because:Inpatient level of care appropriate due to severity of illness  Dispo: The patient is from: Home              Anticipated d/c is to: Home               Patient currently is not medically stable to d/c.   Difficult to place patient No       Level of care: Med-Surg  Consultants:  GI  Procedures:  None  Antimicrobials:  None   Subjective: Seen and examined.  Reports improvement in abdominal pain since admission.  Objective: Vitals:   12/07/20 2228 12/07/20 2318 12/08/20 0452 12/08/20 0754  BP: 125/83 (!) 142/87 123/80 (!) 128/93  Pulse: 71 73 69 63  Resp:  16 16 20   Temp: 98.7 F (37.1 C) 98.6 F (37 C) 98.3 F (36.8 C) 98 F (36.7 C)  TempSrc: Oral Oral Oral Oral  SpO2: 97% 100% 100% 100%  Weight:   129.7 kg   Height:   6' (1.829 m)     Intake/Output Summary (Last 24 hours) at 12/08/2020 1011 Last data filed at 12/08/2020 0548 Gross per 24 hour  Intake --  Output 0 ml  Net 0 ml   Filed Weights   12/07/20 1618 12/08/20 0452  Weight: 127 kg 129.7 kg    Examination:  General exam: Appears calm and comfortable  Respiratory system: Clear to auscultation. Respiratory effort normal. Cardiovascular system: S1 & S2 heard, RRR. No JVD, murmurs, rubs, gallops or clicks. No pedal edema. Gastrointestinal system: Obese, nondistended, diffuse mild tender to palpation, increased bowel sounds Central nervous system: Alert and oriented. No focal neurological deficits. Extremities: Symmetric 5 x 5 power. Skin: No rashes, lesions or ulcers Psychiatry: Judgement and insight appear normal. Mood & affect appropriate.  Data Reviewed: I have personally reviewed following labs and imaging studies  CBC: Recent Labs  Lab 12/07/20 1618 12/08/20 0416  WBC 14.9* 11.1*  HGB 14.9 14.4  HCT 45.0 45.3  MCV 82.1 81.5  PLT 458* 468*   Basic Metabolic Panel: Recent Labs  Lab 12/07/20 1618 12/08/20 0416  NA 139 137  K 4.4 4.1  CL 103 101  CO2 28 27  GLUCOSE 104* 124*  BUN 12 10  CREATININE 0.93 0.84  CALCIUM 9.3 9.2   GFR: Estimated Creatinine Clearance: 185.5 mL/min (by C-G formula based on SCr of 0.84  mg/dL). Liver Function Tests: Recent Labs  Lab 12/07/20 1618  AST 21  ALT 19  ALKPHOS 60  BILITOT 0.6  PROT 7.4  ALBUMIN 4.3   Recent Labs  Lab 12/07/20 1618  LIPASE 35   No results for input(s): AMMONIA in the last 168 hours. Coagulation Profile: No results for input(s): INR, PROTIME in the last 168 hours. Cardiac Enzymes: No results for input(s): CKTOTAL, CKMB, CKMBINDEX, TROPONINI in the last 168 hours. BNP (last 3 results) No results for input(s): PROBNP in the last 8760 hours. HbA1C: No results for input(s): HGBA1C in the last 72 hours. CBG: No results for input(s): GLUCAP in the last 168 hours. Lipid Profile: No results for input(s): CHOL, HDL, LDLCALC, TRIG, CHOLHDL, LDLDIRECT in the last 72 hours. Thyroid Function Tests: No results for input(s): TSH, T4TOTAL, FREET4, T3FREE, THYROIDAB in the last 72 hours. Anemia Panel: No results for input(s): VITAMINB12, FOLATE, FERRITIN, TIBC, IRON, RETICCTPCT in the last 72 hours. Sepsis Labs: No results for input(s): PROCALCITON, LATICACIDVEN in the last 168 hours.  Recent Results (from the past 240 hour(s))  Resp Panel by RT-PCR (Flu A&B, Covid) Nasopharyngeal Swab     Status: None   Collection Time: 12/07/20  8:04 PM   Specimen: Nasopharyngeal Swab; Nasopharyngeal(NP) swabs in vial transport medium  Result Value Ref Range Status   SARS Coronavirus 2 by RT PCR NEGATIVE NEGATIVE Final    Comment: (NOTE) SARS-CoV-2 target nucleic acids are NOT DETECTED.  The SARS-CoV-2 RNA is generally detectable in upper respiratory specimens during the acute phase of infection. The lowest concentration of SARS-CoV-2 viral copies this assay can detect is 138 copies/mL. A negative result does not preclude SARS-Cov-2 infection and should not be used as the sole basis for treatment or other patient management decisions. A negative result may occur with  improper specimen collection/handling, submission of specimen other than  nasopharyngeal swab, presence of viral mutation(s) within the areas targeted by this assay, and inadequate number of viral copies(<138 copies/mL). A negative result must be combined with clinical observations, patient history, and epidemiological information. The expected result is Negative.  Fact Sheet for Patients:  BloggerCourse.com  Fact Sheet for Healthcare Providers:  SeriousBroker.it  This test is no t yet approved or cleared by the [place] FDA and  has been authorized for detection and/or diagnosis of SARS-CoV-2 by FDA under an Emergency Use Authorization (EUA). This EUA will remain  in effect (meaning this test can be used) for the duration of the COVID-19 declaration under Section 564(b)(1) of the Act, 21 U.S.C.section 360bbb-3(b)(1), unless the authorization is terminated  or revoked sooner.       Influenza A by PCR NEGATIVE NEGATIVE Final   Influenza B by PCR NEGATIVE NEGATIVE Final    Comment: (NOTE) The Xpert Xpress SARS-CoV-2/FLU/RSV plus assay is intended as an aid in the diagnosis of influenza from Nasopharyngeal swab specimens and should  not be used as a sole basis for treatment. Nasal washings and aspirates are unacceptable for Xpert Xpress SARS-CoV-2/FLU/RSV testing.  Fact Sheet for Patients: BloggerCourse.com  Fact Sheet for Healthcare Providers: SeriousBroker.it  This test is not yet approved or cleared by the [place] FDA and has been authorized for detection and/or diagnosis of SARS-CoV-2 by FDA under an Emergency Use Authorization (EUA). This EUA will remain in effect (meaning this test can be used) for the duration of the COVID-19 declaration under Section 564(b)(1) of the Act, 21 U.S.C. section 360bbb-3(b)(1), unless the authorization is terminated or revoked.  Performed at [facility], [address] [place], [place]  [address]          Radiology Studies: CT ABDOMEN PELVIS W CONTRAST  Result Date: 12/07/2020 CLINICAL DATA:  History of Crohn's disease with right lower quadrant pain. EXAM: CT ABDOMEN AND PELVIS WITH CONTRAST TECHNIQUE: Multidetector CT imaging of the abdomen and pelvis was performed using the standard protocol following bolus administration of intravenous contrast. CONTRAST:  41mL OMNIPAQUE IOHEXOL 350 MG/ML SOLN COMPARISON:  None. FINDINGS: Lower chest: No acute abnormality. Hepatobiliary: There is diffuse fatty infiltration of the liver parenchyma. No focal liver abnormality is seen. No gallstones, gallbladder wall thickening, or biliary dilatation. Pancreas: Unremarkable. No pancreatic ductal dilatation or surrounding inflammatory changes. Spleen: Normal in size without focal abnormality. Adrenals/Urinary Tract: Adrenal glands are unremarkable. Kidneys are normal, without renal calculi, focal lesion, or hydronephrosis. Bladder is unremarkable. Stomach/Bowel: Stomach is within normal limits. Postoperative changes are seen with surgically anastomosed bowel noted along the expected region of the distal ascending colon. Multiple dilated, moderate to markedly thickened and inflamed loops of distal and terminal ileum are seen (maximum small bowel diameter of approximately 3.1 cm). A transition zone is seen at the surgical anastomosis site. A mild amount of peri-intestinal fluid is present in this region. Vascular/Lymphatic: Aortic atherosclerosis. No enlarged abdominal or pelvic lymph nodes. Reproductive: Prostate is unremarkable. Other: No abdominal wall hernia or abnormality. Musculoskeletal: No acute or significant osseous findings. IMPRESSION: 1. Postoperative changes and adjacent moderate to marked severity enteritis of the terminal/distal ileum with subsequent partial small bowel obstruction. 2. Hepatic steatosis. 3. Aortic atherosclerosis. Aortic Atherosclerosis (ICD10-I70.0). Electronically Signed   By:  Aram Candela M.D.   On: 12/07/2020 18:54   DG Abd 2 Views  Result Date: 12/08/2020 CLINICAL DATA:  25 year old male with partial small bowel obstruction suspected on CT yesterday. Crohn disease. EXAM: ABDOMEN - 2 VIEW COMPARISON:  CT Abdomen and Pelvis 10/30/2020. FINDINGS: Upright and supine views of the abdomen and pelvis. Negative lung bases. Bowel anastomosis redemonstrated in the right abdomen. Increased distention of the urinary bladder. Bowel-gas pattern appears stable from the CT scout view yesterday, although the fluid-filled right abdominal loops were not apparent at that time either. Other abdominal and pelvic visceral contours appear normal. No osseous abnormality identified. IMPRESSION: Stable bowel-gas pattern from the CT scout view yesterday, appearing normal although the fluid distended right abdominal small bowel loops in vicinity of the bowel anastomosis were occult at that time. No free air. Electronically Signed   By: Odessa Fleming M.D.   On: 12/08/2020 08:33        Scheduled Meds:  enoxaparin (LOVENOX) injection  60 mg Subcutaneous Q24H   [START ON 12/09/2020] methylPREDNISolone (SOLU-MEDROL) injection  60 mg Intravenous Daily   Continuous Infusions:  sodium chloride 100 mL/hr at 12/08/20 0721     LOS: 1 day    Time spent: 35 minutes  Tresa Moore, MD [facility] Pager [phone]  If 7PM-7AM, please contact night-coverage 12/08/2020, 10:11 AM

## 2020-12-27 ENCOUNTER — Encounter: Payer: Self-pay | Admitting: Emergency Medicine

## 2020-12-27 ENCOUNTER — Other Ambulatory Visit: Payer: Self-pay

## 2020-12-27 DIAGNOSIS — R1031 Right lower quadrant pain: Secondary | ICD-10-CM | POA: Diagnosis present

## 2020-12-27 DIAGNOSIS — Z5321 Procedure and treatment not carried out due to patient leaving prior to being seen by health care provider: Secondary | ICD-10-CM | POA: Diagnosis not present

## 2020-12-27 LAB — CBC
HCT: 43.2 % (ref 39.0–52.0)
Hemoglobin: 14.4 g/dL (ref 13.0–17.0)
MCH: 27.5 pg (ref 26.0–34.0)
MCHC: 33.3 g/dL (ref 30.0–36.0)
MCV: 82.4 fL (ref 80.0–100.0)
Platelets: 393 10*3/uL (ref 150–400)
RBC: 5.24 MIL/uL (ref 4.22–5.81)
RDW: 14.4 % (ref 11.5–15.5)
WBC: 14.4 10*3/uL — ABNORMAL HIGH (ref 4.0–10.5)
nRBC: 0 % (ref 0.0–0.2)

## 2020-12-27 MED ORDER — ONDANSETRON 4 MG PO TBDP: 4.0000 mg | ORAL_TABLET | Freq: Once | ORAL | Status: DC | PRN

## 2020-12-27 NOTE — ED Triage Notes (Signed)
Pt arrived via POV with reports of RLQ abd pain that started around 2pm when he went to work, denies any N/V/D.   Pt has hx Crohn's Disease and was recently admitted for exacerbation of C.D.

## 2020-12-28 ENCOUNTER — Emergency Department
Admission: EM | Admit: 2020-12-28 | Discharge: 2020-12-28 | Disposition: A | Discharge status: Home / Self Care | Payer: 59 | Attending: Emergency Medicine | Admitting: Emergency Medicine

## 2020-12-28 LAB — COMPREHENSIVE METABOLIC PANEL
ALT: 21 U/L (ref 0–44)
AST: 15 U/L (ref 15–41)
Albumin: 4.5 g/dL (ref 3.5–5.0)
Alkaline Phosphatase: 58 U/L (ref 38–126)
Anion gap: 10 (ref 5–15)
BUN: 18 mg/dL (ref 6–20)
CO2: 27 mmol/L (ref 22–32)
Calcium: 9.8 mg/dL (ref 8.9–10.3)
Chloride: 98 mmol/L (ref 98–111)
Creatinine, Ser: 0.98 mg/dL (ref 0.61–1.24)
GFR, Estimated: >60 mL/min (ref >60)
Glucose, Bld: 105 mg/dL — ABNORMAL HIGH (ref 70–99)
Potassium: 4.6 mmol/L (ref 3.5–5.1)
Sodium: 135 mmol/L (ref 135–145)
Total Bilirubin: 0.9 mg/dL (ref 0.3–1.2)
Total Protein: 7.9 g/dL (ref 6.5–8.1)

## 2020-12-28 LAB — URINALYSIS, COMPLETE (UACMP) WITH MICROSCOPIC
Bacteria, UA: NONE SEEN
Bilirubin Urine: NEGATIVE
Glucose, UA: NEGATIVE mg/dL
Hgb urine dipstick: NEGATIVE
Ketones, ur: NEGATIVE mg/dL
Leukocytes,Ua: NEGATIVE
Nitrite: NEGATIVE
Protein, ur: NEGATIVE mg/dL
Specific Gravity, Urine: >1.03 — ABNORMAL HIGH (ref 1.005–1.030)
Squamous Epithelial / HPF: NONE SEEN (ref 0–5)
WBC, UA: NONE SEEN WBC/hpf (ref 0–5)
pH: 5.5 (ref 5.0–8.0)

## 2020-12-28 LAB — LIPASE, BLOOD: Lipase: 34 U/L (ref 11–51)

## 2020-12-28 NOTE — ED Notes (Signed)
Pt called for repeat VS with no answer.  

## 2021-02-20 ENCOUNTER — Other Ambulatory Visit: Payer: Self-pay

## 2021-02-20 ENCOUNTER — Ambulatory Visit: Payer: 59 | Admitting: Anesthesiology

## 2021-02-20 ENCOUNTER — Encounter
Admission: RE | Disposition: A | Discharge status: Home / Self Care | Payer: Self-pay | Source: Home / Self Care | Attending: Internal Medicine

## 2021-02-20 ENCOUNTER — Ambulatory Visit
Admission: RE | Admit: 2021-02-20 | Discharge: 2021-02-20 | Disposition: A | Discharge status: Home / Self Care | Payer: 59 | Attending: Internal Medicine | Admitting: Internal Medicine

## 2021-02-20 ENCOUNTER — Encounter: Payer: Self-pay | Admitting: Internal Medicine

## 2021-02-20 DIAGNOSIS — K50012 Crohn's disease of small intestine with intestinal obstruction: Secondary | ICD-10-CM | POA: Insufficient documentation

## 2021-02-20 DIAGNOSIS — R112 Nausea with vomiting, unspecified: Secondary | ICD-10-CM | POA: Insufficient documentation

## 2021-02-20 DIAGNOSIS — R768 Other specified abnormal immunological findings in serum: Secondary | ICD-10-CM | POA: Diagnosis not present

## 2021-02-20 DIAGNOSIS — Z9049 Acquired absence of other specified parts of digestive tract: Secondary | ICD-10-CM | POA: Diagnosis not present

## 2021-02-20 DIAGNOSIS — Z980 Intestinal bypass and anastomosis status: Secondary | ICD-10-CM | POA: Diagnosis not present

## 2021-02-20 DIAGNOSIS — R1031 Right lower quadrant pain: Secondary | ICD-10-CM | POA: Diagnosis not present

## 2021-02-20 DIAGNOSIS — K2980 Duodenitis without bleeding: Secondary | ICD-10-CM | POA: Insufficient documentation

## 2021-02-20 DIAGNOSIS — G4730 Sleep apnea, unspecified: Secondary | ICD-10-CM | POA: Diagnosis not present

## 2021-02-20 DIAGNOSIS — K529 Noninfective gastroenteritis and colitis, unspecified: Secondary | ICD-10-CM | POA: Insufficient documentation

## 2021-02-20 HISTORY — PX: COLONOSCOPY: SHX5424

## 2021-02-20 HISTORY — PX: ESOPHAGOGASTRODUODENOSCOPY: SHX5428

## 2021-02-20 SURGERY — COLONOSCOPY
Anesthesia: General

## 2021-02-20 MED ORDER — SODIUM CHLORIDE 0.9 % IV SOLN: INTRAVENOUS | Status: DC

## 2021-02-20 MED ORDER — DEXMEDETOMIDINE HCL IN NACL 200 MCG/50ML IV SOLN
INTRAVENOUS | Status: AC
  Filled 2021-02-20: qty 50

## 2021-02-20 MED ORDER — PROPOFOL 10 MG/ML IV BOLUS
INTRAVENOUS | Status: AC
  Filled 2021-02-20: qty 20

## 2021-02-20 MED ORDER — DEXMEDETOMIDINE (PRECEDEX) IN NS 20 MCG/5ML (4 MCG/ML) IV SYRINGE
PREFILLED_SYRINGE | INTRAVENOUS | Status: DC | PRN
  Administered 2021-02-20: 12 ug via INTRAVENOUS

## 2021-02-20 MED ORDER — LIDOCAINE HCL (PF) 2 % IJ SOLN
INTRAMUSCULAR | Status: AC
  Filled 2021-02-20: qty 5

## 2021-02-20 MED ORDER — PROPOFOL 500 MG/50ML IV EMUL
INTRAVENOUS | Status: DC | PRN
  Administered 2021-02-20: 175 ug/kg/min via INTRAVENOUS

## 2021-02-20 MED ORDER — PROPOFOL 10 MG/ML IV BOLUS
INTRAVENOUS | Status: DC | PRN
  Administered 2021-02-20: 20 mg via INTRAVENOUS
  Administered 2021-02-20: 40 mg via INTRAVENOUS
  Administered 2021-02-20: 60 mg via INTRAVENOUS

## 2021-02-20 MED ORDER — LIDOCAINE HCL (CARDIAC) PF 100 MG/5ML IV SOSY
PREFILLED_SYRINGE | INTRAVENOUS | Status: DC | PRN
  Administered 2021-02-20: 50 mg via INTRAVENOUS

## 2021-02-20 MED ORDER — PROPOFOL 500 MG/50ML IV EMUL
INTRAVENOUS | Status: AC
  Filled 2021-02-20: qty 50

## 2021-02-20 NOTE — Anesthesia Procedure Notes (Signed)
Date/Time: 02/20/2021 12:34 PM Performed by: Ginger Carne, CRNA Pre-anesthesia Checklist: Patient identified, Emergency Drugs available, Suction available, Patient being monitored and Timeout performed Patient Re-evaluated:Patient Re-evaluated prior to induction Oxygen Delivery Method: Supernova nasal CPAP Preoxygenation: Pre-oxygenation with 100% oxygen Induction Type: IV induction

## 2021-02-20 NOTE — Anesthesia Postprocedure Evaluation (Signed)
Anesthesia Post Note  Patient: Theo Marrocco  Procedure(s) Performed: COLONOSCOPY ESOPHAGOGASTRODUODENOSCOPY (EGD)  Patient location during evaluation: PACU Anesthesia Type: General Level of consciousness: awake and alert Pain management: pain level controlled Vital Signs Assessment: post-procedure vital signs reviewed and stable Respiratory status: spontaneous breathing, nonlabored ventilation, respiratory function stable and patient connected to nasal cannula oxygen Cardiovascular status: blood pressure returned to baseline and stable Postop Assessment: no apparent nausea or vomiting Anesthetic complications: no   No notable events documented.   Last Vitals:  Vitals:   02/20/21 1349 02/20/21 1405  BP: (!) 140/109 (!) 155/104  Pulse: 83 74  Resp: (!) 8 10  Temp:    SpO2: 100% 100%    Last Pain:  Vitals:   02/20/21 1405  TempSrc:   PainSc: 2                  Cleda Mccreedy Graylon Amory

## 2021-02-20 NOTE — Transfer of Care (Signed)
Immediate Anesthesia Transfer of Care Note  Patient: Isaiah Andersen  Procedure(s) Performed: COLONOSCOPY ESOPHAGOGASTRODUODENOSCOPY (EGD)  Patient Location: PACU  Anesthesia Type:General  Level of Consciousness: awake and drowsy  Airway & Oxygen Therapy: Patient Spontanous Breathing  Post-op Assessment: Report given to RN and Post -op Vital signs reviewed and stable  Post vital signs: Reviewed and stable  Last Vitals:  Vitals Value Taken Time  BP 147/96 02/20/21 1339  Temp    Pulse 100 02/20/21 1339  Resp 18 02/20/21 1339  SpO2 99 % 02/20/21 1339  Vitals shown include unvalidated device data.  Last Pain:  Vitals:   02/20/21 1158  TempSrc: Temporal  PainSc: 0-No pain         Complications: No notable events documented.

## 2021-02-20 NOTE — Anesthesia Preprocedure Evaluation (Signed)
Anesthesia Evaluation  Patient identified by MRN, date of birth, ID band Patient awake    Reviewed: Allergy & Precautions, NPO status , Patient's Chart, lab work & pertinent test results  Airway Mallampati: III  TM Distance: >3 FB Neck ROM: full    Dental  (+) Chipped   Pulmonary neg shortness of breath, sleep apnea ,  Signs and symptoms suggestive of sleep apnea     Pulmonary exam normal        Cardiovascular (-) angina(-) Past MI negative cardio ROS Normal cardiovascular exam     Neuro/Psych negative neurological ROS  negative psych ROS   GI/Hepatic negative GI ROS, Neg liver ROS, neg GERD  ,  Endo/Other  Morbid obesity  Renal/GU negative Renal ROS  negative genitourinary   Musculoskeletal   Abdominal   Peds  Hematology negative hematology ROS (+)   Anesthesia Other Findings Past Medical History: No date: Crohn's colitis (HCC)  Past Surgical History: No date: COLONOSCOPY No date: ESOPHAGOGASTRODUODENOSCOPY     Reproductive/Obstetrics negative OB ROS                             Anesthesia Physical Anesthesia Plan  ASA: 3  Anesthesia Plan: General   Post-op Pain Management:    Induction: Intravenous  PONV Risk Score and Plan: Propofol infusion and TIVA  Airway Management Planned: Natural Airway and Nasal Cannula  Additional Equipment:   Intra-op Plan:   Post-operative Plan:   Informed Consent: I have reviewed the patients History and Physical, chart, labs and discussed the procedure including the risks, benefits and alternatives for the proposed anesthesia with the patient or authorized representative who has indicated his/her understanding and acceptance.     Dental Advisory Given  Plan Discussed with: Anesthesiologist, CRNA and Surgeon  Anesthesia Plan Comments: (Patient consented for risks of anesthesia including but not limited to:  - adverse reactions to  medications - risk of airway placement if required - damage to eyes, teeth, lips or other oral mucosa - nerve damage due to positioning  - sore throat or hoarseness - Damage to heart, brain, nerves, lungs, other parts of body or loss of life  Patient voiced understanding.)        Anesthesia Quick Evaluation

## 2021-02-20 NOTE — H&P (Addendum)
Outpatient short stay form Pre-procedure 02/20/2021 12:10 PM Isaiah Andersen, M.D.  Primary Physician: None  Reason for visit:  abdominal pain, diarrhea, Crohn's disease of the small bowel.  History of present illness:  Isaiah Andersen is a pleasant 25 year old African-American male presenting for 33-month complaint of abdominal cramping in the right lower quadrant, history of Crohn's terminal ileitis status post partial bowel resection circa 2013 consisting of ileocolectomy. Follow-up CT scan performed at [facility] in September 2022 revealed evidence of surgery with inflammation/wall thickening of the neoterminal ileum approximately 1 cm proximal to the anastomosis. There was some question bridging inflammation in the area possibly signifying developing fistula. The patient was seen in the emergency room and I was called from [facility] also in September. Apparently had recommended the patient have a dose of loading IV Solu-Medrol followed by a rapid steroid taper, which the patient completed. He said that he improved quite a bit for about a month but then had recurrent pain in the right lower quadrant and some nausea as well as occasional vomiting. Patient is tolerating foods at this time without any vomiting. He denies any rectal bleeding but is having loose stools daily only about 1 or 2. Normal bowel habit is usually normal Bristol stool scale of 4. He continues to complain of bloating and discomfort in the right lower quadrant although not severe. The pain is relieved to a moderate degree with passing flatus or having a bowel movement. Patient denies any extraintestinal manifestations of inflammatory bowel disease such as back pain, visual changes, joint aches, rashes, tender nodules on the legs. Patient denies any involuntary weight loss.     Current Facility-Administered Medications:    0.9 %  sodium chloride infusion, , Intravenous, Continuous, Isaiah Andersen, Isaiah Nearing, MD, Last  Rate: 20 mL/hr at 02/20/21 1205, New Bag at 02/20/21 1205  Medications Prior to Admission  Medication Sig Dispense Refill Last Dose   ondansetron (ZOFRAN) 4 MG tablet Take 1 tablet (4 mg total) by mouth every 6 (six) hours as needed for nausea. 20 tablet 0      No Known Allergies   Past Medical History:  Diagnosis Date   Crohn's colitis (HCC)     Review of systems:  Otherwise negative.    Physical Exam  Gen: Alert, oriented. Appears stated age.  HEENT: [place]/AT. PERRLA. Lungs: CTA, no wheezes. CV: RR nl S1, S2. Abd: soft, benign, no masses. BS+ Ext: No edema. Pulses 2+    Planned procedures: Proceed with EGD and colonoscopy. The patient understands the nature of the planned procedure, indications, risks, alternatives and potential complications including but not limited to bleeding, infection, perforation, damage to internal organs and possible oversedation/side effects from anesthesia. The patient agrees and gives consent to proceed.  Please refer to procedure notes for findings, recommendations and patient disposition/instructions.     Isaiah Andersen, M.D. Gastroenterology 02/20/2021  12:10 PM

## 2021-02-20 NOTE — OR Nursing (Signed)
Surgical Anastomosis reached at 1252.

## 2021-02-20 NOTE — Op Note (Signed)
[facility] Gastroenterology Patient Name: Isaiah Andersen Procedure Date: 02/20/2021 10:27 AM MRN: [id number] Account #: [id number] Date of Birth: 1996/03/11 Admit Type: Outpatient Age: 25 Room: [facility] ENDO ROOM 1 Gender: Male Note Status: Finalized Instrument Name: Upper Endoscope [id number] Procedure:             Upper GI endoscopy Indications:           Abdominal pain in the right lower quadrant, Positive                         celiac serologies, Nausea with vomiting Providers:             Boykin Nearing. Norma Fredrickson MD, MD Referring MD:          No Local Md, MD (Referring MD) Medicines:             Propofol per Anesthesia Complications:         No immediate complications. Procedure:             Pre-Anesthesia Assessment:                        - The risks and benefits of the procedure and the                         sedation options and risks were discussed with the                         patient. All questions were answered and informed                         consent was obtained.                        - Patient identification and proposed procedure were                         verified prior to the procedure by the nurse. The                         procedure was verified in the procedure room.                        - ASA Grade Assessment: III - A patient with severe                         systemic disease.                        - After reviewing the risks and benefits, the patient                         was deemed in satisfactory condition to undergo the                         procedure.                        After obtaining informed consent, the endoscope was  passed under direct vision. Throughout the procedure,                         the patient's blood pressure, pulse, and oxygen                         saturations were monitored continuously. The Endoscope                         was introduced through the mouth, and advanced to the                          third part of duodenum. The upper GI endoscopy was                         accomplished without difficulty. The patient tolerated                         the procedure well. Findings:      The esophagus was normal.      The stomach was normal.      The examined duodenum was normal. Biopsies for histology were taken with       a cold forceps for evaluation of celiac disease. Impression:            - Normal esophagus.                        - Normal stomach.                        - Normal examined duodenum. Biopsied. Recommendation:        - Await pathology results.                        - Proceed with colonoscopy Procedure Code(s):     --- Professional ---                        [phone], Esophagogastroduodenoscopy, flexible,                         transoral; with biopsy, single or multiple Diagnosis Code(s):     --- Professional ---                        R11.2, Nausea with vomiting, unspecified                        R76.8, Other specified abnormal immunological findings                         in serum                        R10.31, Right lower quadrant pain CPT copyright 2019 [employer]. All rights reserved. The codes documented in this report are preliminary and upon coder review may  be revised to meet current compliance requirements. Stanton Kidney MD, MD 02/20/2021 12:46:24 PM This report has been signed electronically. Number of Addenda: 0 Note Initiated On: 02/20/2021 10:27 AM Estimated Blood Loss:  Estimated blood loss: none.  [facility]

## 2021-02-20 NOTE — Op Note (Signed)
[facility] Gastroenterology Patient Name: Isaiah Andersen Procedure Date: 02/20/2021 10:26 AM MRN: [id number] Account #: [id number] Date of Birth: May 25, 1995 Admit Type: Outpatient Age: 25 Room: [facility] ENDO ROOM 1 Gender: Male Note Status: Finalized Instrument Name: Prentice Docker [id number] Procedure:             Colonoscopy Indications:           Chronic diarrhea, Crohn's disease of the small bowel Providers:             Boykin Nearing. Norma Fredrickson MD, MD Referring MD:          No Local Md, MD (Referring MD) Medicines:             Propofol per Anesthesia Complications:         No immediate complications. Estimated blood loss:                         Minimal. Procedure:             Pre-Anesthesia Assessment:                        - The risks and benefits of the procedure and the                         sedation options and risks were discussed with the                         patient. All questions were answered and informed                         consent was obtained.                        - Patient identification and proposed procedure were                         verified prior to the procedure by the nurse. The                         procedure was verified in the procedure room.                        - ASA Grade Assessment: III - A patient with severe                         systemic disease.                        - After reviewing the risks and benefits, the patient                         was deemed in satisfactory condition to undergo the                         procedure.                        After obtaining informed consent, the colonoscope was  passed under direct vision. Throughout the procedure,                         the patient's blood pressure, pulse, and oxygen                         saturations were monitored continuously. The                         Colonoscope was introduced through the anus and                         advanced  to the the ileocolonic anastomosis. The                         colonoscopy was somewhat difficult due to bowel                         stenosis. Successful completion of the procedure was                         aided by performing the maneuvers documented (below)                         in this report. The patient tolerated the procedure                         well. The quality of the bowel preparation was                         adequate. Ileocolonic anastomosis and neoterminal                         ileum were photographed. Findings:      The perianal and digital rectal examinations were normal. Pertinent       negatives include normal sphincter tone and no palpable rectal lesions.      Normal mucosa was found in the entire colon. Biopsies for histology were       taken with a cold forceps from the right colon, left colon and rectum       for evaluation of microscopic colitis.      The ileal surgical anastomosis contained a benign-appearing, intrinsic       severe stenosis measuring 1 cm (in length) x 5 mm (inner diameter) that       was traversed after dilation. A TTS dilator was passed through the       scope. Dilation with an 11-19-08 mm and a 01-22-11 mm anastomotic balloon       dilator was performed. The dilation site was examined following       endoscope reinsertion and showed moderate mucosal disruption. Estimated       blood loss was minimal. Biopsies were taken with a cold forceps for       histology.      The exam was otherwise without abnormality on direct and retroflexion       views. Impression:            - Normal mucosa in the entire examined colon. Biopsied.                        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                         Dilated. Biopsied.                        - The examination was otherwise normal on direct and                         retroflexion views. Recommendation:        - Await pathology results from EGD, also performed                          today.                        - Patient has a contact number available for                         emergencies. The signs and symptoms of potential                         delayed complications were discussed with the patient.                         Return to normal activities tomorrow. Written                         discharge instructions were provided to the patient.                        - Resume previous diet.                        - Continue present medications.                        - Await pathology results.                        - No aspirin, ibuprofen, naproxen, or other                         non-steroidal anti-inflammatory drugs.                        - Use prednisone 40 mg PO once a day for 5 days.                        - Use prednisone 30 mg PO once a day for 5 days.                        - Use prednisone 20 mg PO once a day for 5 days.                        - Use prednisone 10 mg PO once a day until office                         follow up.                        -  Return to my office in 2 weeks.                        - The findings and recommendations were discussed with                         the patient.                        - Perform a computed tomographic (CT scan)                         enterography at appointment to be scheduled. Procedure Code(s):     --- Professional ---                        [phone], Colonoscopy, flexible; with transendoscopic                         balloon dilation                        [id number], Colonoscopy, flexible; with biopsy, single or                         multiple Diagnosis Code(s):     --- Professional ---                        K52.9, Noninfective gastroenteritis and colitis,                         unspecified                        K50.012, Crohn's disease of small intestine with                         intestinal obstruction CPT copyright 2019 American Medical Association. All rights reserved. The  codes documented in this report are preliminary and upon coder review may  be revised to meet current compliance requirements. Efrain Sella MD, MD 02/20/2021 1:37:14 PM This report has been signed electronically. Number of Addenda: 0 Note Initiated On: 02/20/2021 10:26 AM Scope Withdrawal Time: 0 hours 29 minutes 42 seconds  Total Procedure Duration: 0 hours 33 minutes 51 seconds  Estimated Blood Loss:  Estimated blood loss was minimal.      [facility]

## 2021-02-20 NOTE — Interval H&P Note (Signed)
History and Physical Interval Note:  02/20/2021 12:14 PM  Isaiah Andersen  has presented today for surgery, with the diagnosis of Abdominal pain, RLQ (right lower quadrant) (R10.31) Bilious vomiting with nausea (R11.14) Crohn's disease of ileum, unspecified complication (CMS-HCC) (K50.019) Diarrheal disease (K52.9).  The various methods of treatment have been discussed with the patient and family. After consideration of risks, benefits and other options for treatment, the patient has consented to  Procedure(s): COLONOSCOPY (N/A) ESOPHAGOGASTRODUODENOSCOPY (EGD) (N/A) as a surgical intervention.  The patient's history has been reviewed, patient examined, no change in status, stable for surgery.  I have reviewed the patient's chart and labs.  Questions were answered to the patient's satisfaction.     [place], [place]

## 2021-02-21 ENCOUNTER — Encounter: Payer: Self-pay | Admitting: Internal Medicine

## 2021-02-25 LAB — SURGICAL PATHOLOGY

## 2022-05-13 ENCOUNTER — Emergency Department
Admission: EM | Admit: 2022-05-13 | Discharge: 2022-05-13 | Disposition: A | Discharge status: Home / Self Care | Payer: 59 | Attending: Emergency Medicine | Admitting: Emergency Medicine

## 2022-05-13 ENCOUNTER — Encounter: Payer: Self-pay | Admitting: Emergency Medicine

## 2022-05-13 ENCOUNTER — Emergency Department: Payer: 59

## 2022-05-13 ENCOUNTER — Other Ambulatory Visit: Payer: Self-pay

## 2022-05-13 DIAGNOSIS — R1030 Lower abdominal pain, unspecified: Secondary | ICD-10-CM

## 2022-05-13 DIAGNOSIS — E860 Dehydration: Secondary | ICD-10-CM | POA: Diagnosis not present

## 2022-05-13 DIAGNOSIS — K5090 Crohn's disease, unspecified, without complications: Secondary | ICD-10-CM | POA: Diagnosis not present

## 2022-05-13 LAB — URINALYSIS, ROUTINE W REFLEX MICROSCOPIC
Bacteria, UA: NONE SEEN
Bilirubin Urine: NEGATIVE
Glucose, UA: NEGATIVE mg/dL
Hgb urine dipstick: NEGATIVE
Ketones, ur: 20 mg/dL — AB
Leukocytes,Ua: NEGATIVE
Nitrite: NEGATIVE
Protein, ur: 100 mg/dL — AB
Specific Gravity, Urine: 1.03 (ref 1.005–1.030)
pH: 5 (ref 5.0–8.0)

## 2022-05-13 LAB — CBC WITH DIFFERENTIAL/PLATELET
Abs Immature Granulocytes: 0.05 10*3/uL (ref 0.00–0.07)
Basophils Absolute: 0 10*3/uL (ref 0.0–0.1)
Basophils Relative: 0 %
Eosinophils Absolute: 0 10*3/uL (ref 0.0–0.5)
Eosinophils Relative: 0 %
HCT: 42.8 % (ref 39.0–52.0)
Hemoglobin: 13.7 g/dL (ref 13.0–17.0)
Immature Granulocytes: 0 %
Lymphocytes Relative: 5 %
Lymphs Abs: 0.8 10*3/uL (ref 0.7–4.0)
MCH: 24.7 pg — ABNORMAL LOW (ref 26.0–34.0)
MCHC: 32 g/dL (ref 30.0–36.0)
MCV: 77.3 fL — ABNORMAL LOW (ref 80.0–100.0)
Monocytes Absolute: 0.4 10*3/uL (ref 0.1–1.0)
Monocytes Relative: 3 %
Neutro Abs: 13.4 10*3/uL — ABNORMAL HIGH (ref 1.7–7.7)
Neutrophils Relative %: 92 %
Platelets: 494 10*3/uL — ABNORMAL HIGH (ref 150–400)
RBC: 5.54 MIL/uL (ref 4.22–5.81)
RDW: 15.2 % (ref 11.5–15.5)
WBC: 14.7 10*3/uL — ABNORMAL HIGH (ref 4.0–10.5)
nRBC: 0 % (ref 0.0–0.2)

## 2022-05-13 LAB — COMPREHENSIVE METABOLIC PANEL
ALT: 14 U/L (ref 0–44)
AST: 17 U/L (ref 15–41)
Albumin: 4.5 g/dL (ref 3.5–5.0)
Alkaline Phosphatase: 78 U/L (ref 38–126)
Anion gap: 8 (ref 5–15)
BUN: 16 mg/dL (ref 6–20)
CO2: 27 mmol/L (ref 22–32)
Calcium: 9.8 mg/dL (ref 8.9–10.3)
Chloride: 103 mmol/L (ref 98–111)
Creatinine, Ser: 1.01 mg/dL (ref 0.61–1.24)
GFR, Estimated: >60 mL/min (ref >60)
Glucose, Bld: 125 mg/dL — ABNORMAL HIGH (ref 70–99)
Potassium: 4.1 mmol/L (ref 3.5–5.1)
Sodium: 138 mmol/L (ref 135–145)
Total Bilirubin: 0.9 mg/dL (ref 0.3–1.2)
Total Protein: 7.9 g/dL (ref 6.5–8.1)

## 2022-05-13 LAB — LIPASE, BLOOD: Lipase: 34 U/L (ref 11–51)

## 2022-05-13 LAB — SEDIMENTATION RATE: Sed Rate: 7 mm/hr (ref 0–16)

## 2022-05-13 MED ORDER — METHYLPREDNISOLONE SODIUM SUCC 40 MG IJ SOLR
40.0000 mg | Freq: Once | INTRAMUSCULAR | Status: AC
  Administered 2022-05-13: 40 mg via INTRAVENOUS
  Filled 2022-05-13: qty 1

## 2022-05-13 MED ORDER — PREDNISONE 10 MG PO TABS: ORAL_TABLET | ORAL | 0 refills | Status: AC

## 2022-05-13 MED ORDER — MORPHINE SULFATE (PF) 4 MG/ML IV SOLN
4.0000 mg | Freq: Once | INTRAVENOUS | Status: AC
  Administered 2022-05-13: 4 mg via INTRAVENOUS
  Filled 2022-05-13: qty 1

## 2022-05-13 MED ORDER — OXYCODONE-ACETAMINOPHEN 5-325 MG PO TABS: 1.0000 | ORAL_TABLET | ORAL | 0 refills | Status: DC | PRN

## 2022-05-13 MED ORDER — IOHEXOL 350 MG/ML SOLN
100.0000 mL | Freq: Once | INTRAVENOUS | Status: AC | PRN
  Administered 2022-05-13: 100 mL via INTRAVENOUS

## 2022-05-13 MED ORDER — SODIUM CHLORIDE 0.9 % IV BOLUS
1000.0000 mL | Freq: Once | INTRAVENOUS | Status: AC
  Administered 2022-05-13: 1000 mL via INTRAVENOUS

## 2022-05-13 MED ORDER — ONDANSETRON 4 MG PO TBDP: 4.0000 mg | ORAL_TABLET | Freq: Three times a day (TID) | ORAL | 0 refills | Status: DC | PRN

## 2022-05-13 MED ORDER — ONDANSETRON HCL 4 MG/2ML IJ SOLN
4.0000 mg | Freq: Once | INTRAMUSCULAR | Status: AC
  Administered 2022-05-13: 4 mg via INTRAVENOUS
  Filled 2022-05-13: qty 2

## 2022-05-13 NOTE — Discharge Instructions (Addendum)
You may take pain and nausea medicines as needed.  Take prednisone taper as prescribed.  Return to the ER for worsening symptoms, persistent vomiting, difficulty breathing or other concerns.

## 2022-05-13 NOTE — ED Provider Notes (Signed)
[facility] Provider Note    Event Date/Time   First MD Initiated Contact with Patient 05/13/22 [phone]     (approximate)   History   Abdominal Pain   HPI  Isaiah Andersen is a 27 y.o. male who presents to the ED from home with a chief complaint of abdominal pain.  Reports a 1 day history of lower abdominal pain associated with nausea, no vomiting.  History of Crohn's disease status post ileocolectomy. Taking Vedoluzimab infusions.  Denies fever/chills, cough, chest pain, shortness of breath, dysuria or diarrhea.     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     Past Surgical History   Past Surgical History:  Procedure Laterality Date   COLONOSCOPY     COLONOSCOPY N/A 02/20/2021   Procedure: COLONOSCOPY;  Surgeon: Toledo, Benay Pike, MD;  Location: [facility] ENDOSCOPY;  Service: Gastroenterology;  Laterality: N/A;   ESOPHAGOGASTRODUODENOSCOPY     ESOPHAGOGASTRODUODENOSCOPY N/A 02/20/2021   Procedure: ESOPHAGOGASTRODUODENOSCOPY (EGD);  Surgeon: Toledo, Benay Pike, MD;  Location: [facility] ENDOSCOPY;  Service: Gastroenterology;  Laterality: N/A;   ileocolectomy       Home Medications   Prior to Admission medications   Medication Sig Start Date End Date Taking? Authorizing Provider  ondansetron (ZOFRAN-ODT) 4 MG disintegrating tablet Take 1 tablet (4 mg total) by mouth every 8 (eight) hours as needed for nausea or vomiting. 05/13/22  Yes Paulette Blanch, MD  oxyCODONE-acetaminophen (PERCOCET/ROXICET) 5-325 MG tablet Take 1 tablet by mouth every 4 (four) hours as needed for severe pain. 05/13/22  Yes Paulette Blanch, MD  predniSONE (DELTASONE) 10 MG tablet 4 tablets daily x 14 days, then 3 tablets daily x 7 days, then 2 tablets daily x 7 days, then 1 tablet daily x 7 days 05/13/22  Yes Paulette Blanch, MD   ondansetron (ZOFRAN) 4 MG tablet Take 1 tablet (4 mg total) by mouth every 6 (six) hours as needed for nausea. 12/08/20   Sidney Ace, MD     Allergies  Patient has no known allergies.   Family History  History reviewed. No pertinent family history.   Physical Exam  Triage Vital Signs: ED Triage Vitals  Enc Vitals Group     BP 05/13/22 0330 (!) 163/101     Pulse Rate 05/13/22 0330 80     Resp 05/13/22 0330 18     Temp 05/13/22 0330 98.1 F (36.7 C)     Temp Source 05/13/22 0330 Oral     SpO2 05/13/22 0330 100 %     Weight 05/13/22 0328 245 lb (111.1 kg)     Height 05/13/22 0328 6' (1.829 m)     Head Circumference --      Peak Flow --      Pain Score 05/13/22 0328 5     Pain Loc --      Pain Edu? --      Excl. in [place]? --     Updated Vital Signs: BP 126/89   Pulse 83   Temp 98.1 F (36.7 C) (Oral)   Resp 18   Ht 6' (1.829 m)   Wt 111.1 kg   SpO2 99%   BMI 33.23 kg/m    General: Awake, mild distress.  CV:  RRR.  Good peripheral perfusion.  Resp:  Normal effort.  CTAB. Abd:  Mildly tender lower abdomen right greater than left without rebound or guarding.  No distention.  Other:  No truncal vesicles.   ED Results / Procedures / Treatments  Labs (all labs ordered are listed, but only abnormal results are displayed) Labs Reviewed  CBC WITH DIFFERENTIAL/PLATELET - Abnormal; Notable for the following components:      Result Value   WBC 14.7 (*)    MCV 77.3 (*)    MCH 24.7 (*)    Platelets 494 (*)    Neutro Abs 13.4 (*)    All other components within normal limits  COMPREHENSIVE METABOLIC PANEL - Abnormal; Notable for the following components:   Glucose, Bld 125 (*)    All other components within normal limits  URINALYSIS, ROUTINE W REFLEX MICROSCOPIC - Abnormal; Notable for the following components:   Color, Urine AMBER (*)    APPearance CLOUDY (*)    Ketones, ur 20 (*)    Protein, ur 100 (*)    All other components within normal limits   LIPASE, BLOOD  SEDIMENTATION RATE     EKG  None   RADIOLOGY I have independently visualized and interpreted patient's CT scan as well as noted the radiology interpretation:  CT abdomen/pelvis: Consistent with active Crohn's disease and inflammatory structures; no evidence for perforation or abscess  Official radiology report(s): CT Abdomen Pelvis W Contrast  Result Date: 05/13/2022 CLINICAL DATA:  Lower abdominal pain with nausea. History of Crohn disease. EXAM: CT ABDOMEN AND PELVIS WITH CONTRAST TECHNIQUE: Multidetector CT imaging of the abdomen and pelvis was performed using the standard protocol following bolus administration of intravenous contrast. RADIATION DOSE REDUCTION: This exam was performed according to the departmental dose-optimization program which includes automated exposure control, adjustment of the mA and/or kV according to patient size and/or use of iterative reconstruction technique. CONTRAST:  134mL OMNIPAQUE IOHEXOL 350 MG/ML SOLN COMPARISON:  12/07/2020 FINDINGS: Lower chest: 5 mm right middle lobe pulmonary nodule identified on image 7 of series 4. This portion of the lung was not included on the prior exam. 2 mm right lower lobe nodule visible on 24/4 is unchanged. 2-3 mm left lower lobe nodule on 36/4 is stable in the interval. Hepatobiliary: No suspicious focal abnormality within the liver parenchyma. There is no evidence for gallstones, gallbladder wall thickening, or pericholecystic fluid. No intrahepatic or extrahepatic biliary dilation. Pancreas: No focal mass lesion. No dilatation of the main duct. No intraparenchymal cyst. No peripancreatic edema. Spleen: No splenomegaly. No focal mass lesion. Adrenals/Urinary Tract: No adrenal nodule or mass. Possible punctate nonobstructing stone upper pole right kidney (35/2). Left kidney unremarkable. No evidence for hydroureter. The urinary bladder appears normal for the degree of distention. Stomach/Bowel: Stomach is  unremarkable. No gastric wall thickening. No evidence of outlet obstruction. Duodenum is normally positioned as is the ligament of Treitz. Distal small bowel is fluid-filled and dilated up to 4.5 cm diameter. This most dilated 4.5 cm diameter loop is immediately proximal to an abrupt narrowing leading into a a short segment of abnormal small bowel demonstrating luminal narrowing, circumferential wall thickening and substantial perienteric edema/fluid. This abnormal appearance extends into the neo terminal ileum in this patient status post right hemicolectomy. Colon is diffusely fluid-filled to the level of the rectum. Vascular/Lymphatic: No abdominal aortic aneurysm. No abdominal aortic atherosclerotic calcification. There is no gastrohepatic or hepatoduodenal ligament lymphadenopathy. No retroperitoneal or mesenteric lymphadenopathy. Upper normal lymph nodes are seen in the right mesentery. No pelvic sidewall lymphadenopathy. Reproductive:  The prostate gland and seminal vesicles are unremarkable. Other: Trace free fluid is seen in the pelvis. No evidence for intraperitoneal free air. No pelvic abscess. Musculoskeletal: No worrisome lytic or sclerotic osseous abnormality. IMPRESSION: 1. Distal small bowel is fluid-filled and dilated up to 4.5 cm diameter. This most dilated loop is immediately proximal to an abrupt narrowing leading into a short segment of abnormal small bowel demonstrating luminal narrowing, circumferential wall thickening and substantial perienteric edema/fluid. This abnormal appearance extends into the neo terminal ileum in this patient status post right hemicolectomy. Imaging features are compatible with active Crohn's disease and inflammatory stricture. No evidence for perforation or abscess. 2. Colon is diffusely fluid-filled to the level of the rectum. 3. Possible punctate nonobstructing stone upper pole right kidney. 4. Tiny bilateral pulmonary nodules measuring up to 5 mm. 2 of the 3  visualized nodules are stable in the interval since prior study consistent with benign etiology. The 5 mm right middle lobe nodule was not included on the prior exam. No follow-up needed if patient is low-risk.This recommendation follows the consensus statement: Guidelines for Management of Incidental Pulmonary Nodules Detected on CT Images: From the Fleischner Society 2017; Radiology 2017; 284:228-243. Electronically Signed   By: Kennith Center M.D.   On: 05/13/2022 05:29     PROCEDURES:  Critical Care performed: No  .1-3 Lead EKG Interpretation  Performed by: Irean Hong, MD Authorized by: Irean Hong, MD     Interpretation: normal     ECG rate:  80   ECG rate assessment: normal     Rhythm: sinus rhythm     Ectopy: none     Conduction: normal   Comments:     Patient placed on cardiac monitor to evaluate for arrhythmias    MEDICATIONS ORDERED IN ED: Medications  sodium chloride 0.9 % bolus 1,000 mL (0 mLs Intravenous Stopped 05/13/22 0605)  ondansetron (ZOFRAN) injection 4 mg (4 mg Intravenous Given 05/13/22 0409)  morphine (PF) 4 MG/ML injection 4 mg (4 mg Intravenous Given 05/13/22 0411)  iohexol (OMNIPAQUE) 350 MG/ML injection 100 mL (100 mLs Intravenous Contrast Given 05/13/22 0503)  methylPREDNISolone sodium succinate (SOLU-MEDROL) 40 mg/mL injection 40 mg (40 mg Intravenous Given 05/13/22 0605)     IMPRESSION / MDM / ASSESSMENT AND PLAN / ED COURSE  I reviewed the triage vital signs and the nursing notes.                             27 year old male presenting with lower abdominal pain, history of Crohn's disease. Differential diagnosis includes, but is not limited to, acute appendicitis, renal colic, testicular torsion, urinary tract infection/pyelonephritis, prostatitis,  epididymitis, diverticulitis, small bowel obstruction or ileus, colitis, abdominal aortic aneurysm, gastroenteritis, hernia, etc. I have personally reviewed patient's records and note patient's last GI  office visit from 02/26/2022.  Patient's presentation is most consistent with acute presentation with potential threat to life or bodily function.  Will obtain lab work, CT abdomen/pelvis.  Initiate IV fluid hydration, IV morphine for pain.  With IV Zofran for nausea.  Will reassess.  Clinical Course as of 05/13/22 0635  Wed May 13, 2022  0559 Patient pain-free at this time.  Spoke with Dr. Allegra Lai, on-call for GI regarding patient's CT scan.  Recommends Solu-Medrol 40mg  daily, then outpatient prednisone 40 mg daily x 2 weeks, then taper down by 10 mg every week until finished.  Patient will follow-up closely with Dr. .  Strict return precautions given.  Patient verbalizes understanding and agrees with plan of care. [JS]    Clinical Course User Index [JS] Paulette Blanch, MD     FINAL CLINICAL IMPRESSION(S) / ED DIAGNOSES   Final diagnoses:  Lower abdominal pain  Dehydration  Crohn's disease without complication, unspecified gastrointestinal tract location [facility])     Rx / DC Orders   ED Discharge Orders          Ordered    oxyCODONE-acetaminophen (PERCOCET/ROXICET) 5-325 MG tablet  Every 4 hours PRN        05/13/22 0609    ondansetron (ZOFRAN-ODT) 4 MG disintegrating tablet  Every 8 hours PRN        05/13/22 0609    predniSONE (DELTASONE) 10 MG tablet        05/13/22 9735             Note:  This document was prepared using Dragon voice recognition software and may include unintentional dictation errors.   Paulette Blanch, MD 05/13/22 [phone]

## 2022-05-13 NOTE — ED Triage Notes (Signed)
Patient ambulatory to triage with steady gait, without difficulty or distress noted; pt reports since yesterday having lower abd pain accomp by nausea; st hx of same with chron's

## 2022-05-14 ENCOUNTER — Other Ambulatory Visit: Payer: Self-pay | Admitting: Internal Medicine

## 2022-05-14 DIAGNOSIS — K50019 Crohn's disease of small intestine with unspecified complications: Secondary | ICD-10-CM

## 2022-05-28 ENCOUNTER — Ambulatory Visit
Admission: RE | Admit: 2022-05-28 | Discharge: 2022-05-28 | Disposition: A | Discharge status: Home / Self Care | Payer: 59 | Source: Ambulatory Visit | Attending: Internal Medicine | Admitting: Internal Medicine

## 2022-05-28 DIAGNOSIS — K50019 Crohn's disease of small intestine with unspecified complications: Secondary | ICD-10-CM | POA: Diagnosis present

## 2022-05-28 MED ORDER — GADOBUTROL 1 MMOL/ML IV SOLN
10.0000 mL | Freq: Once | INTRAVENOUS | Status: AC | PRN
  Administered 2022-05-28: 10 mL via INTRAVENOUS

## 2022-07-26 ENCOUNTER — Other Ambulatory Visit: Payer: Self-pay

## 2022-07-26 ENCOUNTER — Emergency Department
Admission: EM | Admit: 2022-07-26 | Discharge: 2022-07-26 | Disposition: A | Discharge status: Home / Self Care | Payer: 59 | Attending: Emergency Medicine | Admitting: Emergency Medicine

## 2022-07-26 DIAGNOSIS — H6122 Impacted cerumen, left ear: Secondary | ICD-10-CM | POA: Diagnosis not present

## 2022-07-26 DIAGNOSIS — H9202 Otalgia, left ear: Secondary | ICD-10-CM | POA: Diagnosis present

## 2022-07-26 MED ORDER — CARBAMIDE PEROXIDE 6.5 % OT SOLN: 5.0000 [drp] | Freq: Two times a day (BID) | OTIC | 2 refills | Status: AC

## 2022-07-26 MED ORDER — PREDNISONE 10 MG PO TABS: 20.0000 mg | ORAL_TABLET | ORAL | 3 refills | Status: AC

## 2022-07-26 NOTE — ED Triage Notes (Signed)
Pt reports left ear pain x 4 days.

## 2022-07-26 NOTE — ED Provider Notes (Signed)
[facility] Provider Note  Patient Contact: 7:38 PM (approximate)   History   Otalgia   HPI  Isaiah Andersen is a 27 y.o. male who presents the emergency department complaining of ear pain.  Patient was trying to use a "scoop" to clean out his ear with earwax.  He states that after he did this he started to have a pressure sensation in his ear and decreased hearing.  He states that he got out some earwax but did not see any blood.  He is not having sharp pain at the time of.  No history of previous injuries or infections to the left ear.     Physical Exam   Triage Vital Signs: ED Triage Vitals  Enc Vitals Group     BP 07/26/22 1911 (!) 146/97     Pulse Rate 07/26/22 1911 77     Resp 07/26/22 1911 14     Temp 07/26/22 1911 98.3 F (36.8 C)     Temp Source 07/26/22 1911 Oral     SpO2 07/26/22 1911 99 %     Weight 07/26/22 1912 250 lb (113.4 kg)     Height 07/26/22 1912 6' (1.829 m)     Head Circumference --      Peak Flow --      Pain Score 07/26/22 1912 7     Pain Loc --      Pain Edu? --      Excl. in [place]? --     Most recent vital signs: Vitals:   07/26/22 1911  BP: (!) 146/97  Pulse: 77  Resp: 14  Temp: 98.3 F (36.8 C)  SpO2: 99%     General: Alert and in no acute distress. ENT:      Ears: EAC on right has mild cerumen, TM is unremarkable.  EAC on left reveals significant cerumen against the TM with no evidence of perforation.  Appears cerumen is impacted.      Nose: No congestion/rhinnorhea.      Mouth/Throat: Mucous membranes are moist.  Cardiovascular:  Good peripheral perfusion Respiratory: Normal respiratory effort without tachypnea or retractions. Lungs CTAB. Musculoskeletal: Full range of motion to all extremities.  Neurologic:  No gross focal neurologic deficits are appreciated.  Skin:   No rash noted Other:   ED Results / Procedures / Treatments   Labs (all labs ordered are listed, but only abnormal results are  displayed) Labs Reviewed - No data to display   EKG     RADIOLOGY    No results found.  PROCEDURES:  Critical Care performed: No  Procedures   MEDICATIONS ORDERED IN ED: Medications - No data to display   IMPRESSION / MDM / ASSESSMENT AND PLAN / ED COURSE  I reviewed the triage vital signs and the nursing notes.                                 Differential diagnosis includes, but is not limited to, TM perforation, otitis externa, otitis media, cerumen impaction  Patient's presentation is most consistent with acute presentation with potential threat to life or bodily function.   Patient's diagnosis is consistent with cerumen impaction.  Patient presents the emergency department complaining of decreased hearing after trying to clean out his ear.  Patient has findings on and physical exam consistent with a cerumen impaction with no evidence of TM perforation, external auditory canal trauma.  Patient will  be prescribed Debrox for same.  Patient also has a history of Crohn's and is on 20 mg of prednisone daily.  He just ran out of prednisone and requested a refill.  This will be prescribed as well.  Follow-up primary care as needed..  Patient is given ED precautions to return to the ED for any worsening or new symptoms.     FINAL CLINICAL IMPRESSION(S) / ED DIAGNOSES   Final diagnoses:  Impacted cerumen of left ear     Rx / DC Orders   ED Discharge Orders          Ordered    carbamide peroxide (DEBROX) 6.5 % OTIC solution  2 times daily        07/26/22 1945    predniSONE (DELTASONE) 10 MG tablet  As directed        07/26/22 1945             Note:  This document was prepared using Dragon voice recognition software and may include unintentional dictation errors.   Lanette Hampshire 07/26/22 1945    Jene Every, MD 07/27/22 1501

## 2022-09-21 IMAGING — CR DG ABDOMEN 2V
1 series · 3 of 3 positions shown · non-contrast
Comparison: CT Abdomen and Pelvis 10/30/2020.

CLINICAL DATA: 25-year-old male with partial small bowel
obstruction suspected on CT yesterday. Crohn disease.

EXAM:
ABDOMEN - 2 VIEW

[Series 1: dg abd 2 views · 0.14mm/px · 3 of 3 slices shown]
[im 1/3]
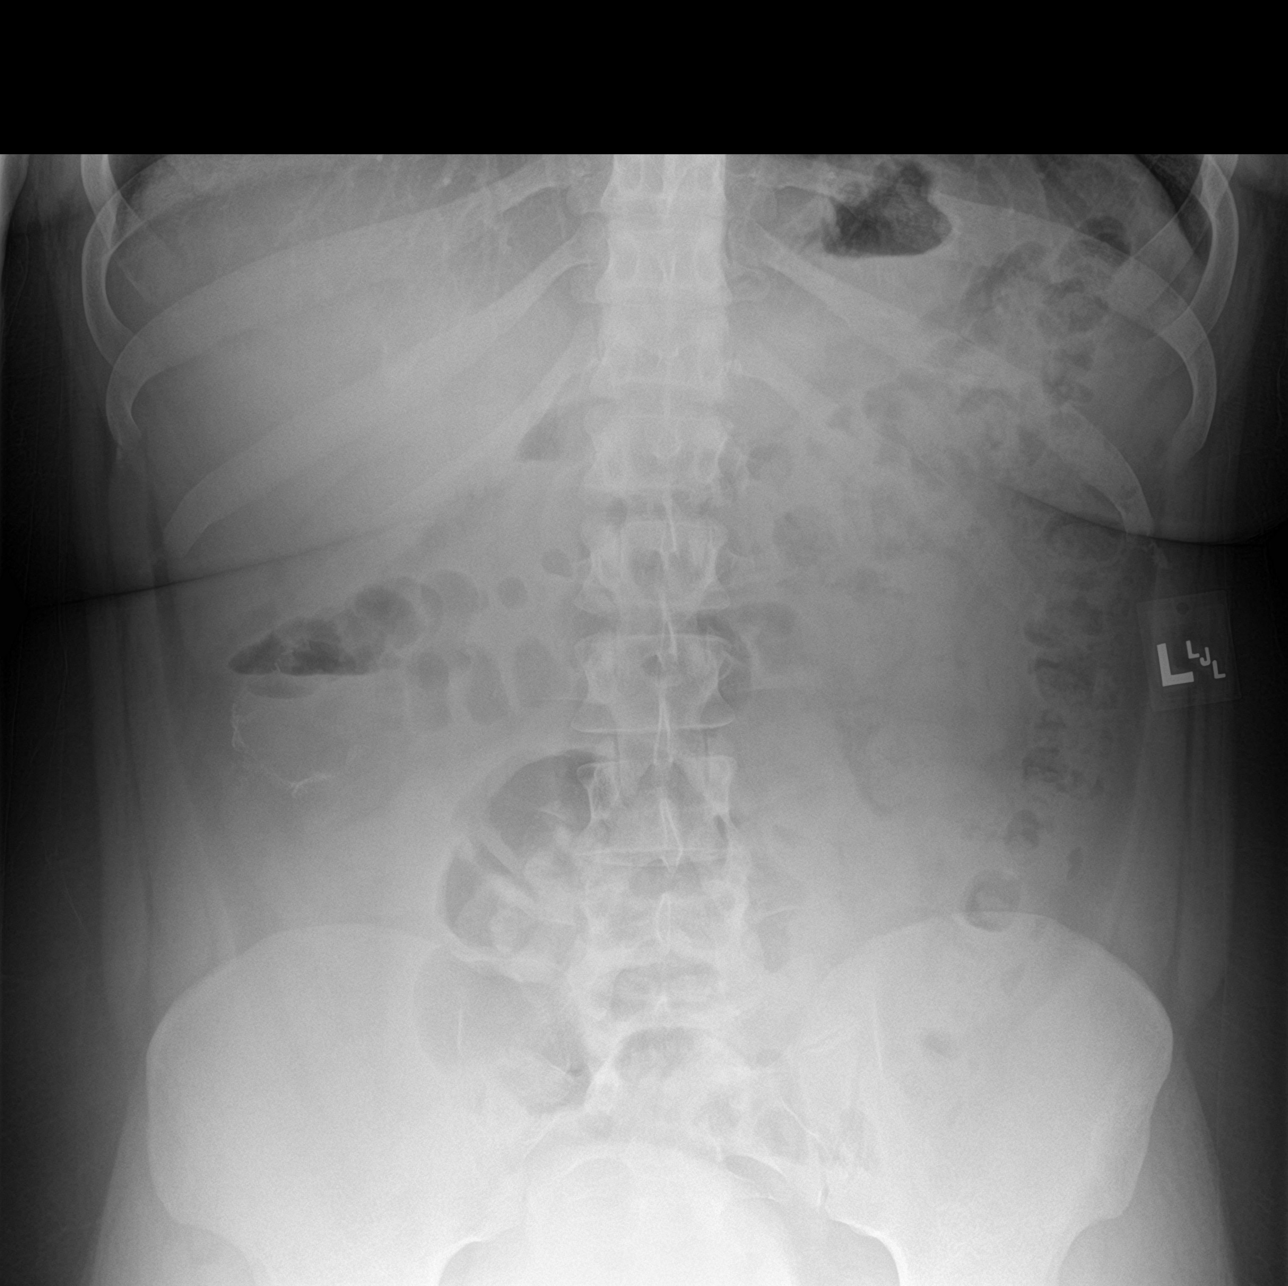
[im 2/3]
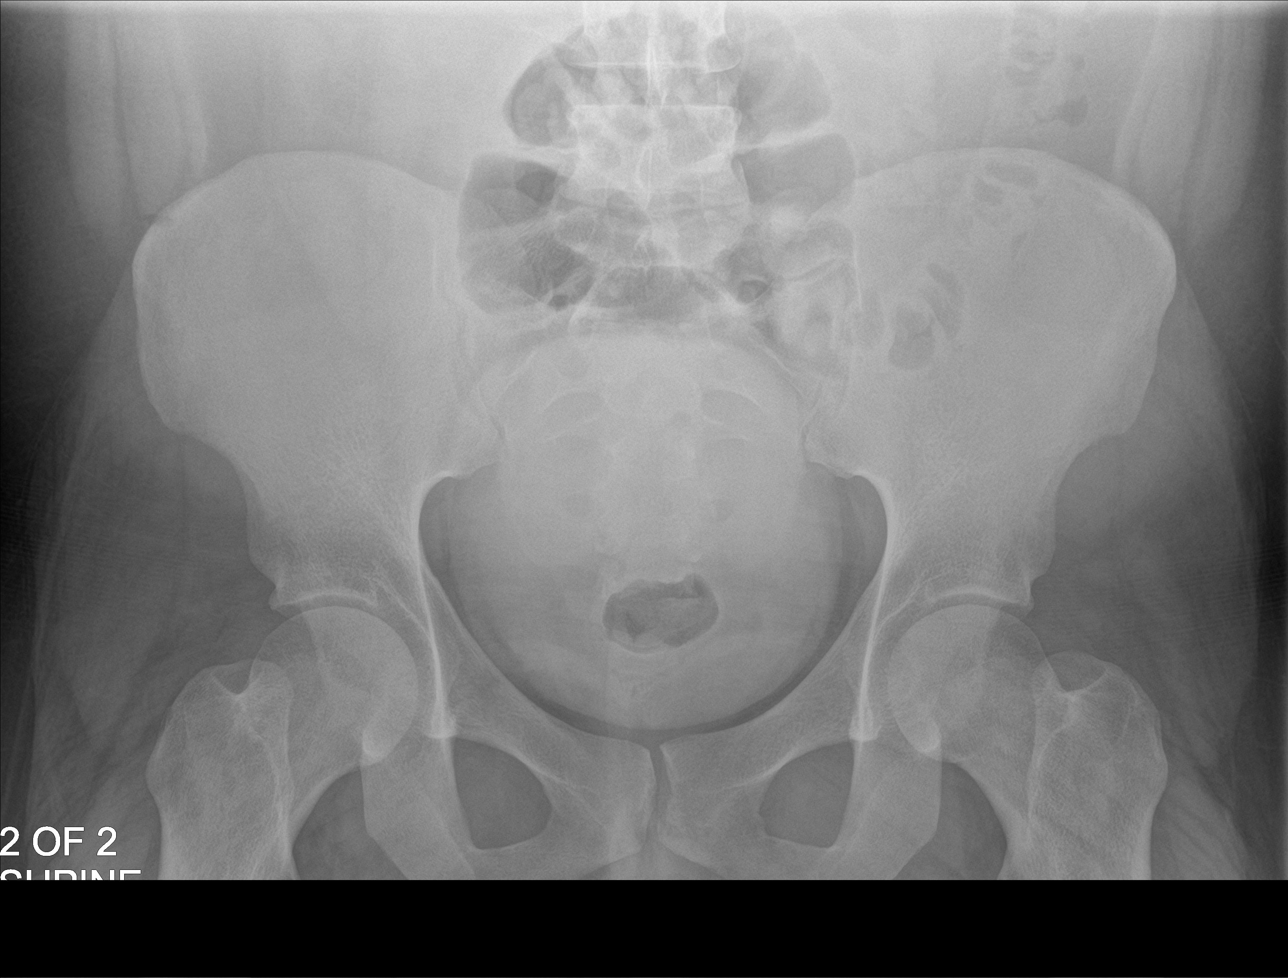
[im 3/3]
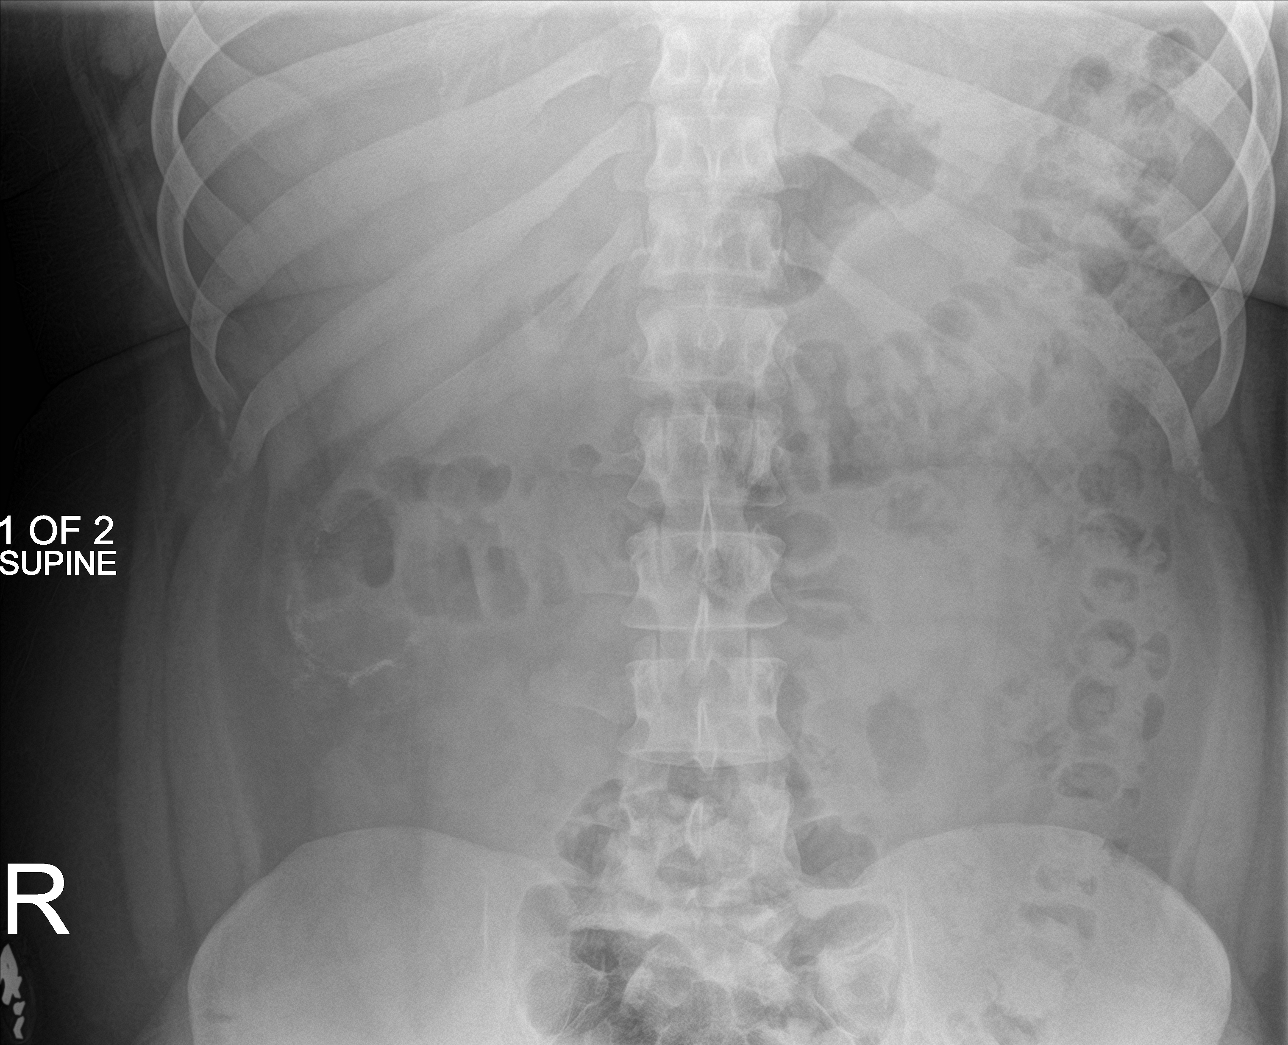

[3 of 3 positions shown; findings below may reference images not displayed]

FINDINGS: Upright and supine views of the abdomen and pelvis. Negative lung
bases. Bowel anastomosis redemonstrated in the right abdomen.
Increased distention of the urinary bladder. Bowel-gas pattern
appears stable from the CT scout view yesterday, although the
fluid-filled right abdominal loops were not apparent at that time
either. Other abdominal and pelvic visceral contours appear normal.
No osseous abnormality identified.
IMPRESSION: Stable bowel-gas pattern from the CT scout view yesterday, appearing
normal although the fluid distended right abdominal small bowel
loops in vicinity of the bowel anastomosis were occult at that time.
No free air.

## 2023-02-11 ENCOUNTER — Emergency Department: Payer: 59

## 2023-02-11 ENCOUNTER — Other Ambulatory Visit: Payer: Self-pay

## 2023-02-11 DIAGNOSIS — M5441 Lumbago with sciatica, right side: Secondary | ICD-10-CM | POA: Insufficient documentation

## 2023-02-11 DIAGNOSIS — M545 Low back pain, unspecified: Secondary | ICD-10-CM | POA: Diagnosis present

## 2023-02-11 DIAGNOSIS — K5900 Constipation, unspecified: Secondary | ICD-10-CM | POA: Diagnosis not present

## 2023-02-11 NOTE — ED Triage Notes (Signed)
Pt presents ambulatory to triage via POV with complaints of lower back pain x 1 week that has worsened over the last few days. Hx of sciatic nerve pain. Endorses the pain is exacerbated when laying on his side. He took OTC pain meds PTA with minimal relief. A&Ox4 at this time. Denies falls, injury, urinary sx.

## 2023-02-12 ENCOUNTER — Emergency Department
Admission: EM | Admit: 2023-02-12 | Discharge: 2023-02-12 | Disposition: A | Discharge status: Home / Self Care | Payer: 59 | Attending: Emergency Medicine | Admitting: Emergency Medicine

## 2023-02-12 DIAGNOSIS — K5900 Constipation, unspecified: Secondary | ICD-10-CM

## 2023-02-12 DIAGNOSIS — M5441 Lumbago with sciatica, right side: Secondary | ICD-10-CM

## 2023-02-12 MED ORDER — KETOROLAC TROMETHAMINE 30 MG/ML IJ SOLN
30.0000 mg | Freq: Once | INTRAMUSCULAR | Status: AC
  Administered 2023-02-12: 30 mg via INTRAMUSCULAR
  Filled 2023-02-12: qty 1

## 2023-02-12 MED ORDER — LIDOCAINE 5 % EX PTCH: 1.0000 | MEDICATED_PATCH | Freq: Two times a day (BID) | CUTANEOUS | 0 refills | Status: AC

## 2023-02-12 NOTE — ED Notes (Addendum)
Rounded on this patient - no concerns at this time. Call bell within reach.

## 2023-02-12 NOTE — ED Notes (Signed)
Patient given discharge instructions including prescriptions x1 and importance of follow up with PCP as needed with stated understanding. Patient stable and ambulatory with steady even gait on dispo.

## 2023-02-12 NOTE — ED Provider Notes (Signed)
[facility] Provider Note    Event Date/Time   First MD Initiated Contact with Patient 02/12/23 0157     (approximate)   History   Back Pain   HPI Pierson Vantol is a 27 y.o. male who presents for evaluation of low back pain radiating down his right buttock, right thigh, and down to the knee.  No weakness or numbness, just pain, worse with ambulation and raising his leg up.  It feels similar to a prior episode when he was told he has sciatica, but the pain was much worse that time.  He denies any difficulty urinating, urinary incontinence, or bowel issues.  He is in no distress currently and said that it feels okay depending on the position he sits in.  No recent fevers.  No difficulty breathing, no dysuria nor hematuria.     Physical Exam   Triage Vital Signs: ED Triage Vitals  Encounter Vitals Group     BP 02/11/23 2251 (!) 163/109     Systolic BP Percentile --      Diastolic BP Percentile --      Pulse Rate 02/11/23 2251 69     Resp 02/11/23 2251 16     Temp 02/11/23 2251 97.8 F (36.6 C)     Temp Source 02/11/23 2251 Oral     SpO2 02/11/23 2251 97 %     Weight 02/11/23 2251 122.5 kg (270 lb)     Height 02/11/23 2251 1.829 m (6')     Head Circumference --      Peak Flow --      Pain Score 02/11/23 2252 6     Pain Loc --      Pain Education --      Exclude from Growth Chart --     Most recent vital signs: Vitals:   02/12/23 0200 02/12/23 0300  BP: (!) 156/103 (!) 153/99  Pulse: 66 73  Resp: 18 18  Temp:  98 F (36.7 C)  SpO2: 100% 100%    General: Awake, no distress.  CV:  Good peripheral perfusion.  Resp:  Normal effort. Speaking easily and comfortably, no accessory muscle usage nor intercostal retractions.   Abd:  No distention.  Morbid obesity. Other:  Mild tenderness to palpation of the soft tissues of the right side of the lumbar spine.  No right flank tenderness to percussion.  Reproducible pain with right straight leg  raise.   ED Results / Procedures / Treatments   Labs (all labs ordered are listed, but only abnormal results are displayed) Labs Reviewed - No data to display   RADIOLOGY I viewed and interpreted the patient's lumbar spine x-rays and there is no evidence of fracture.  The radiologist concurred, and also noted incidentally that the patient has a large stool burden.   PROCEDURES:  Critical Care performed: No  Procedures    IMPRESSION / MDM / ASSESSMENT AND PLAN / ED COURSE  I reviewed the triage vital signs and the nursing notes.                              Differential diagnosis includes, but is not limited to, sciatica, herniated disc, musculoskeletal strain, cauda equina syndrome, transverse myelitis, osteomyelitis, discitis, epidural abscess.  Patient's presentation is most consistent with acute presentation with potential threat to life or bodily function.  Labs/studies ordered: Lumbar spine x-rays  Interventions/Medications given:  Medications  ketorolac (TORADOL) 30  MG/ML injection 30 mg (30 mg Intramuscular Given 02/12/23 0325)    (Note:  hospital course my include additional interventions and/or labs/studies not listed above.)   Symptoms very consistent with sciatica.  No indication of emergent cause of low back pain such as cauda equina syndrome.  I think that NSAIDs would be the best option for the patient and I will avoid prednisone out of concern that it may lead to GI issues including ulcers.  I had my usual and customary sciatica discussion with the patient and he will follow-up as an outpatient.  I gave my usual return precautions.  I also let him know that on the x-ray he appears constipated and I gave him some constipation management recommendations.       FINAL CLINICAL IMPRESSION(S) / ED DIAGNOSES   Final diagnoses:  Acute right-sided low back pain with right-sided sciatica  Constipation, unspecified constipation type     Rx / DC Orders   ED  Discharge Orders     None        Note:  This document was prepared using Dragon voice recognition software and may include unintentional dictation errors.   Loleta Rose, MD 02/12/23 [phone]

## 2023-02-12 NOTE — Discharge Instructions (Addendum)
You were evaluated in the Emergency Department today for back pain. Your evaluation suggests no acute abnormalities which require further intervention at this time.   - Move around as tolerated but avoiding heavy lifting. "Bed rest" is not recommended nor is it the best treatment for low back pain.  - Medications will help control your discomfort: -- Ibuprofen (800 mg every 8 hours for pain, with meals). -- Any other prescriptions you were provided as per label instructions   -- Do not drink alcohol, drive a car, operate machinery, or get up on ladders or heights when taking any prescribed pain medications.  -- Do not drive home if you received prescribed pain medications here in the ED.  Please follow up with your primary care physician as needed or any other providers listed in this paperwork. If you do not have a primary doctor, you can call your insurance company to find one.  If you do not have insurance, you can go to the finance/registration department for more assistance.  Incidentally, it was also noted on your x-ray that it appears you are likely constipated.  Please read through the included information about constipation and consider taking 1 or more over-the-counter stool softeners or laxatives such as Colace and/or MiraLAX.  Return to the ED immediately if you develop any of the following problems: -- Leaking urine or difficulty urinating; -- Inability to control your bowels; -- New numbness or weakness in your legs or numbness between your legs; -- Inability to walk -- Fever

## 2023-03-21 ENCOUNTER — Emergency Department: Payer: 59

## 2023-03-21 ENCOUNTER — Emergency Department
Admission: EM | Admit: 2023-03-21 | Discharge: 2023-03-21 | Disposition: A | Discharge status: Home / Self Care | Payer: 59 | Attending: Emergency Medicine | Admitting: Emergency Medicine

## 2023-03-21 ENCOUNTER — Other Ambulatory Visit: Payer: Self-pay

## 2023-03-21 ENCOUNTER — Encounter: Payer: Self-pay | Admitting: Emergency Medicine

## 2023-03-21 DIAGNOSIS — R1031 Right lower quadrant pain: Secondary | ICD-10-CM

## 2023-03-21 DIAGNOSIS — K529 Noninfective gastroenteritis and colitis, unspecified: Secondary | ICD-10-CM | POA: Diagnosis not present

## 2023-03-21 LAB — COMPREHENSIVE METABOLIC PANEL
ALT: 19 U/L (ref 0–44)
AST: 20 U/L (ref 15–41)
Albumin: 5 g/dL (ref 3.5–5.0)
Alkaline Phosphatase: 62 U/L (ref 38–126)
Anion gap: 12 (ref 5–15)
BUN: 18 mg/dL (ref 6–20)
CO2: 25 mmol/L (ref 22–32)
Calcium: 9.8 mg/dL (ref 8.9–10.3)
Chloride: 99 mmol/L (ref 98–111)
Creatinine, Ser: 1.17 mg/dL (ref 0.61–1.24)
GFR, Estimated: >60 mL/min (ref >60)
Glucose, Bld: 145 mg/dL — ABNORMAL HIGH (ref 70–99)
Potassium: 4.5 mmol/L (ref 3.5–5.1)
Sodium: 136 mmol/L (ref 135–145)
Total Bilirubin: 1.2 mg/dL — ABNORMAL HIGH (ref <1.2)
Total Protein: 8.6 g/dL — ABNORMAL HIGH (ref 6.5–8.1)

## 2023-03-21 LAB — CBC
HCT: 46.9 % (ref 39.0–52.0)
Hemoglobin: 15.7 g/dL (ref 13.0–17.0)
MCH: 27 pg (ref 26.0–34.0)
MCHC: 33.5 g/dL (ref 30.0–36.0)
MCV: 80.6 fL (ref 80.0–100.0)
Platelets: 443 10*3/uL — ABNORMAL HIGH (ref 150–400)
RBC: 5.82 MIL/uL — ABNORMAL HIGH (ref 4.22–5.81)
RDW: 13.4 % (ref 11.5–15.5)
WBC: 13.3 10*3/uL — ABNORMAL HIGH (ref 4.0–10.5)
nRBC: 0 % (ref 0.0–0.2)

## 2023-03-21 LAB — LIPASE, BLOOD: Lipase: 31 U/L (ref 11–51)

## 2023-03-21 LAB — SEDIMENTATION RATE: Sed Rate: 2 mm/h (ref 0–15)

## 2023-03-21 MED ORDER — ONDANSETRON HCL 4 MG/2ML IJ SOLN
4.0000 mg | Freq: Once | INTRAMUSCULAR | Status: AC
Start: 2023-03-21 — End: 2023-03-21
  Administered 2023-03-21: 4 mg via INTRAVENOUS
  Filled 2023-03-21: qty 2

## 2023-03-21 MED ORDER — ONDANSETRON 4 MG PO TBDP: 4.0000 mg | ORAL_TABLET | Freq: Three times a day (TID) | ORAL | 0 refills | Status: AC | PRN

## 2023-03-21 MED ORDER — SODIUM CHLORIDE 0.9 % IV BOLUS
1000.0000 mL | Freq: Once | INTRAVENOUS | Status: AC
  Administered 2023-03-21: 1000 mL via INTRAVENOUS

## 2023-03-21 MED ORDER — IOHEXOL 300 MG/ML  SOLN
100.0000 mL | Freq: Once | INTRAMUSCULAR | Status: AC | PRN
  Administered 2023-03-21: 100 mL via INTRAVENOUS

## 2023-03-21 MED ORDER — MORPHINE SULFATE (PF) 4 MG/ML IV SOLN
4.0000 mg | Freq: Once | INTRAVENOUS | Status: AC
  Administered 2023-03-21: 4 mg via INTRAVENOUS
  Filled 2023-03-21: qty 1

## 2023-03-21 NOTE — ED Notes (Signed)
See triage note  Presents with some abd pain  States he thinks it is his Chron's  Afebrile on arrival

## 2023-03-21 NOTE — ED Provider Notes (Addendum)
[facility] Provider Note    Event Date/Time   First MD Initiated Contact with Patient 03/21/23 1122     (approximate)   History   Abdominal Pain   HPI  Isaiah Andersen is a 27 y.o. male past medical history significant for Crohn's disease who presents to the emergency department abdominal pain.  Abdominal pain started last night after eating something at a birthday party.  Complaining of pain to his right lower abdomen associate with nausea and vomiting.  Complaining of generalized weakness.  No bowel movement since yesterday.  Does not note any blood or constipation or diarrhea.  Prior surgeries for his Crohn's disease in the past.     Physical Exam   Triage Vital Signs: ED Triage Vitals  Encounter Vitals Group     BP 03/21/23 1033 (!) 136/115     Systolic BP Percentile --      Diastolic BP Percentile --      Pulse Rate 03/21/23 1033 92     Resp 03/21/23 1033 16     Temp 03/21/23 1033 98.7 F (37.1 C)     Temp Source 03/21/23 1033 Oral     SpO2 03/21/23 1033 97 %     Weight 03/21/23 1032 270 lb 1 oz (122.5 kg)     Height --      Head Circumference --      Peak Flow --      Pain Score 03/21/23 1031 10     Pain Loc --      Pain Education --      Exclude from Growth Chart --     Most recent vital signs: Vitals:   03/21/23 1033  BP: (!) 136/115  Pulse: 92  Resp: 16  Temp: 98.7 F (37.1 C)  SpO2: 97%    Physical Exam Constitutional:      Appearance: He is well-developed.  HENT:     Head: Atraumatic.  Eyes:     Conjunctiva/sclera: Conjunctivae normal.  Cardiovascular:     Rate and Rhythm: Regular rhythm.  Pulmonary:     Effort: No respiratory distress.  Abdominal:     Tenderness: There is abdominal tenderness in the right lower quadrant.  Musculoskeletal:     Cervical back: Normal range of motion.  Skin:    General: Skin is warm.     Capillary Refill: Capillary refill takes less than 2 seconds.  Neurological:     General: No  focal deficit present.     Mental Status: He is alert. Mental status is at baseline.     IMPRESSION / MDM / ASSESSMENT AND PLAN / ED COURSE  I reviewed the triage vital signs and the nursing notes.  On chart review patient last had a Crohn's flare and was evaluated in the emergency department on 04/2022 and was treated with a steroid course which improved his symptoms.  Followed by Dr. Norma Fredrickson with gastroenterology.  Differential diagnosis including Crohn's flare, appendicitis, bowel obstruction, intra-abdominal abscess  RADIOLOGY I independently reviewed imaging, my interpretation of imaging: CT abdomen and pelvis with contrast -no signs of a bowel obstruction  CT scan was read as no findings of Crohn's flare.  Signs of enteritis.  LABS (all labs ordered are listed, but only abnormal results are displayed) Labs interpreted as -    Labs Reviewed  COMPREHENSIVE METABOLIC PANEL - Abnormal; Notable for the following components:      Result Value   Glucose, Bld 145 (*)    Total  Protein 8.6 (*)    Total Bilirubin 1.2 (*)    All other components within normal limits  CBC - Abnormal; Notable for the following components:   WBC 13.3 (*)    RBC 5.82 (*)    Platelets 443 (*)    All other components within normal limits  LIPASE, BLOOD  SEDIMENTATION RATE  URINALYSIS, ROUTINE W REFLEX MICROSCOPIC  C-REACTIVE PROTEIN     MDM  Treated with IV morphine, antiemetics and IV fluids.  Plan for lab work, adding on ESR and CRP.  CT scan of the abdomen and pelvis to further evaluate  Mild leukocytosis but otherwise lab work is overall reassuring.  Normal acute inflammatory markers.  CT scan reassuring.  On reevaluation patient states that he is feeling much better.  Discussed close follow-up with his gastroenterologist.  Discussed return precautions for any ongoing or worsening symptoms.  Discussed symptomatic treatment.     PROCEDURES:  Critical Care performed:  No  Procedures  Patient's presentation is most consistent with acute presentation with potential threat to life or bodily function.   MEDICATIONS ORDERED IN ED: Medications  sodium chloride 0.9 % bolus 1,000 mL (1,000 mLs Intravenous New Bag/Given 03/21/23 1141)  morphine (PF) 4 MG/ML injection 4 mg (4 mg Intravenous Given 03/21/23 1142)  ondansetron (ZOFRAN) injection 4 mg (4 mg Intravenous Given 03/21/23 1141)  iohexol (OMNIPAQUE) 300 MG/ML solution 100 mL (100 mLs Intravenous Contrast Given 03/21/23 1201)    FINAL CLINICAL IMPRESSION(S) / ED DIAGNOSES   Final diagnoses:  Right lower quadrant abdominal pain  Enteritis     Rx / DC Orders   ED Discharge Orders          Ordered    ondansetron (ZOFRAN-ODT) 4 MG disintegrating tablet  Every 8 hours PRN        03/21/23 1243             Note:  This document was prepared using Dragon voice recognition software and may include unintentional dictation errors.   Corena Herter, MD 03/21/23 1134    Corena Herter, MD 03/21/23 1244

## 2023-03-21 NOTE — ED Triage Notes (Signed)
C/O abdominal pain (chron's flare up) and vomiting since last night.

## 2023-03-21 NOTE — Discharge Instructions (Addendum)
You are seen in the emergency department for abdominal pain.  You had a CT scan done that did not show any findings of a Crohn's flare or a abscess in your abdomen.  It did show inflammation of your colon.  Your lab work was overall normal.  You are given IV fluids, nausea medicine and IV pain medication in the emergency department.  You can take your pain medication as prescribed at home.  You can take Zofran as needed for nausea and vomiting.  Stay hydrated and drink plenty of fluids.  Call Dr. Norma Fredrickson with gastroenterology tomorrow morning to schedule close follow-up appointment.  Return to the emergency department for any worsening symptoms.  zofran (ondansetron) - nausea medication, take 1 tablet every 8 hours as needed for nausea/vomiting.  Thank you for choosing [place] for your health care, it was my pleasure to care for you today!  Corena Herter, MD
